# Patient Record
Sex: Female | Born: 1960
Health system: Southern US, Community
[De-identification: ages and names within clinical notes are randomized; demographics above are authoritative.]

## PROBLEM LIST (undated history)

## (undated) DIAGNOSIS — IMO0002 Reserved for concepts with insufficient information to code with codable children: Secondary | ICD-10-CM

## (undated) DIAGNOSIS — T7840XA Allergy, unspecified, initial encounter: Secondary | ICD-10-CM

## (undated) DIAGNOSIS — Z8632 Personal history of gestational diabetes: Secondary | ICD-10-CM

## (undated) DIAGNOSIS — B009 Herpesviral infection, unspecified: Secondary | ICD-10-CM

## (undated) HISTORY — PX: LASIK: SHX215

## (undated) HISTORY — DX: Herpesviral infection, unspecified: B00.9

## (undated) HISTORY — DX: Reserved for concepts with insufficient information to code with codable children: IMO0002

## (undated) HISTORY — DX: Allergy, unspecified, initial encounter: T78.40XA

## (undated) HISTORY — PX: OTHER SURGICAL HISTORY: SHX169

## (undated) HISTORY — DX: Personal history of gestational diabetes: Z86.32

---

## 2000-08-29 ENCOUNTER — Other Ambulatory Visit: Admission: RE | Admit: 2000-08-29 | Discharge: 2000-08-29 | Payer: Self-pay | Admitting: Gynecology

## 2000-10-17 ENCOUNTER — Encounter: Admission: RE | Admit: 2000-10-17 | Discharge: 2001-01-15 | Payer: Self-pay | Admitting: Gynecology

## 2002-06-02 ENCOUNTER — Other Ambulatory Visit: Admission: RE | Admit: 2002-06-02 | Discharge: 2002-06-02 | Payer: Self-pay | Admitting: Gynecology

## 2003-06-28 ENCOUNTER — Emergency Department (HOSPITAL_COMMUNITY): Admission: AD | Admit: 2003-06-28 | Discharge: 2003-06-28 | Payer: Self-pay | Admitting: Family Medicine

## 2004-01-28 ENCOUNTER — Other Ambulatory Visit: Admission: RE | Admit: 2004-01-28 | Discharge: 2004-01-28 | Payer: Self-pay | Admitting: Gynecology

## 2005-05-24 DIAGNOSIS — IMO0002 Reserved for concepts with insufficient information to code with codable children: Secondary | ICD-10-CM

## 2005-05-24 HISTORY — DX: Reserved for concepts with insufficient information to code with codable children: IMO0002

## 2005-05-25 ENCOUNTER — Other Ambulatory Visit: Admission: RE | Admit: 2005-05-25 | Discharge: 2005-05-25 | Payer: Self-pay | Admitting: Gynecology

## 2006-02-02 ENCOUNTER — Encounter: Admission: RE | Admit: 2006-02-02 | Discharge: 2006-02-02 | Payer: Self-pay | Admitting: Internal Medicine

## 2006-02-02 ENCOUNTER — Ambulatory Visit: Payer: Self-pay | Admitting: Internal Medicine

## 2006-02-28 ENCOUNTER — Other Ambulatory Visit: Admission: RE | Admit: 2006-02-28 | Discharge: 2006-02-28 | Payer: Self-pay | Admitting: Gynecology

## 2006-03-14 ENCOUNTER — Encounter: Admission: RE | Admit: 2006-03-14 | Discharge: 2006-03-14 | Payer: Self-pay | Admitting: Gynecology

## 2006-08-07 ENCOUNTER — Ambulatory Visit: Payer: Self-pay | Admitting: Internal Medicine

## 2006-09-03 ENCOUNTER — Encounter: Admission: RE | Admit: 2006-09-03 | Discharge: 2006-09-03 | Payer: Self-pay | Admitting: Gynecology

## 2007-03-22 ENCOUNTER — Encounter: Admission: RE | Admit: 2007-03-22 | Discharge: 2007-03-22 | Payer: Self-pay | Admitting: Gynecology

## 2007-09-11 ENCOUNTER — Other Ambulatory Visit: Admission: RE | Admit: 2007-09-11 | Discharge: 2007-09-11 | Payer: Self-pay | Admitting: Gynecology

## 2008-01-17 ENCOUNTER — Other Ambulatory Visit: Admission: RE | Admit: 2008-01-17 | Discharge: 2008-01-17 | Payer: Self-pay | Admitting: Gynecology

## 2008-04-01 ENCOUNTER — Ambulatory Visit: Payer: Self-pay | Admitting: Family Medicine

## 2008-06-04 ENCOUNTER — Ambulatory Visit: Payer: Self-pay | Admitting: Internal Medicine

## 2008-06-29 ENCOUNTER — Ambulatory Visit: Payer: Self-pay | Admitting: Internal Medicine

## 2008-08-10 ENCOUNTER — Ambulatory Visit: Payer: Self-pay | Admitting: Internal Medicine

## 2008-08-20 ENCOUNTER — Telehealth: Payer: Self-pay | Admitting: Internal Medicine

## 2008-09-16 ENCOUNTER — Telehealth: Payer: Self-pay | Admitting: Internal Medicine

## 2008-10-12 ENCOUNTER — Other Ambulatory Visit: Admission: RE | Admit: 2008-10-12 | Discharge: 2008-10-12 | Payer: Self-pay | Admitting: Gynecology

## 2008-10-12 ENCOUNTER — Encounter: Payer: Self-pay | Admitting: Gynecology

## 2008-10-12 ENCOUNTER — Ambulatory Visit: Payer: Self-pay | Admitting: Gynecology

## 2009-02-02 ENCOUNTER — Ambulatory Visit: Payer: Self-pay | Admitting: Internal Medicine

## 2009-02-02 LAB — CONVERTED CEMR LAB
BUN: 20 mg/dL (ref 6–23)
Bilirubin, Direct: 0.1 mg/dL (ref 0.0–0.3)
CO2: 29 meq/L (ref 19–32)
Calcium: 9.3 mg/dL (ref 8.4–10.5)
Chloride: 106 meq/L (ref 96–112)
Cholesterol: 251 mg/dL — ABNORMAL HIGH (ref 0–200)
Creatinine, Ser: 0.9 mg/dL (ref 0.4–1.2)
Direct LDL: 160.1 mg/dL
Eosinophils Absolute: 0.2 10*3/uL (ref 0.0–0.7)
Glucose, Urine, Semiquant: NEGATIVE
Ketones, urine, test strip: NEGATIVE
Lymphocytes Relative: 33.6 % (ref 12.0–46.0)
MCHC: 34 g/dL (ref 30.0–36.0)
MCV: 91.1 fL (ref 78.0–100.0)
Monocytes Absolute: 0.4 10*3/uL (ref 0.1–1.0)
Neutrophils Relative %: 49.8 % (ref 43.0–77.0)
Platelets: 349 10*3/uL (ref 150.0–400.0)
Total Bilirubin: 0.8 mg/dL (ref 0.3–1.2)
Triglycerides: 51 mg/dL (ref 0.0–149.0)
VLDL: 10.2 mg/dL (ref 0.0–40.0)
WBC: 4.4 10*3/uL — ABNORMAL LOW (ref 4.5–10.5)
pH: 5.5

## 2009-04-08 ENCOUNTER — Ambulatory Visit: Payer: Self-pay | Admitting: Internal Medicine

## 2009-05-14 ENCOUNTER — Encounter: Admission: RE | Admit: 2009-05-14 | Discharge: 2009-05-14 | Payer: Self-pay | Admitting: Gynecology

## 2009-10-27 ENCOUNTER — Ambulatory Visit: Payer: Self-pay | Admitting: Gynecology

## 2009-10-27 ENCOUNTER — Other Ambulatory Visit: Admission: RE | Admit: 2009-10-27 | Discharge: 2009-10-27 | Payer: Self-pay | Admitting: Gynecology

## 2009-11-03 ENCOUNTER — Ambulatory Visit: Payer: Self-pay | Admitting: Gynecology

## 2010-03-17 ENCOUNTER — Ambulatory Visit: Payer: Self-pay | Admitting: Gynecology

## 2010-03-21 ENCOUNTER — Ambulatory Visit: Payer: Self-pay | Admitting: Family Medicine

## 2010-03-21 LAB — CONVERTED CEMR LAB: Rapid Strep: POSITIVE

## 2010-04-05 ENCOUNTER — Telehealth: Payer: Self-pay | Admitting: Internal Medicine

## 2010-06-09 ENCOUNTER — Encounter: Admission: RE | Admit: 2010-06-09 | Discharge: 2010-06-09 | Payer: Self-pay | Admitting: Gynecology

## 2010-07-09 ENCOUNTER — Emergency Department (HOSPITAL_COMMUNITY)
Admission: EM | Admit: 2010-07-09 | Discharge: 2010-07-09 | Payer: Self-pay | Source: Home / Self Care | Admitting: Family Medicine

## 2010-08-21 LAB — CONVERTED CEMR LAB
Pap Smear: NORMAL
Vit D, 25-Hydroxy: 31 ng/mL (ref 30–89)

## 2010-08-23 ENCOUNTER — Ambulatory Visit
Admission: RE | Admit: 2010-08-23 | Discharge: 2010-08-23 | Payer: Self-pay | Source: Home / Self Care | Attending: Gynecology | Admitting: Gynecology

## 2010-08-25 NOTE — Assessment & Plan Note (Signed)
Summary: SORE THROAT // RS/PT RSC/CJR   Vital Signs:  Patient profile:   50 year old female Temp:     98.0 degrees F oral BP sitting:   90 / 68  (left arm) Cuff size:   regular  Vitals Entered By: Sid Falcon LPN (March 21, 2010 1:51 PM) CC: sore throat   History of Present Illness: Patient seen with onset of sore throat this past Friday. She's had some malaise and intermittent headache. Some chills but no definite fever. Minimal nasal congestion. No significant cough. No ill contacts.  Allergies (verified): No Known Drug Allergies  Past History:  Past Medical History: Last updated: 06/29/2008 Allergic rhinitis  Review of Systems      See HPI  Physical Exam  General:  Well-developed,well-nourished,in no acute distress; alert,appropriate and cooperative throughout examination Eyes:  No corneal or conjunctival inflammation noted. EOMI. Perrla. Funduscopic exam benign, without hemorrhages, exudates or papilledema. Vision grossly normal. Ears:  External ear exam shows no significant lesions or deformities.  Otoscopic examination reveals clear canals, tympanic membranes are intact bilaterally without bulging, retraction, inflammation or discharge. Hearing is grossly normal bilaterally. Mouth:  minimal erythema without exudate Neck:  minimal anterior cervical adenopathy Lungs:  Normal respiratory effort, chest expands symmetrically. Lungs are clear to auscultation, no crackles or wheezes. Heart:  Normal rate and regular rhythm. S1 and S2 normal without gallop, murmur, click, rub or other extra sounds. Skin:  no rashes.     Impression & Recommendations:  Problem # 1:  SORE THROAT (ICD-462)  positive strep. Amoxil 875 mg b.i.d. for 10 days Orders: Rapid Strep (37628)  Her updated medication list for this problem includes:    Amoxicillin 875 Mg Tabs (Amoxicillin) ..... One by mouth two times a day for 10 days  Complete Medication List: 1)  Astepro 0.15 % Soln  (Azelastine hcl) .... Two spray q nare daily 2)  Nasonex 50 Mcg/act Susp (Mometasone furoate) .... One spray each nostril two times a day 3)  Pataday 0.2 % Soln (Olopatadine hcl) .... One drop each eye daily as needed 4)  Lexapro 5 Mg Tabs (Escitalopram oxalate) .Marland Kitchen.. 1 once daily 5)  Amoxicillin 875 Mg Tabs (Amoxicillin) .... One by mouth two times a day for 10 days  Patient Instructions: 1)  Continue Motrin, Alleve, or Tylenol for symptom relief. Prescriptions: AMOXICILLIN 875 MG TABS (AMOXICILLIN) one by mouth two times a day for 10 days  #20 x 0   Entered and Authorized by:   Evelena Peat MD   Signed by:   Evelena Peat MD on 03/21/2010   Method used:   Electronically to        [facility] [address] [id number]* (retail)       [address]       [address]       [place], [place]  [address]       Ph: [phone]       Fax: [phone]   RxID:   [id number]   Laboratory Results    Other Tests  Rapid Strep: positive Comments: Rita Ohara  March 21, 2010 2:15 PM   Kit Test Internal QC: Positive   (Normal Range: Negative)

## 2010-08-25 NOTE — Progress Notes (Signed)
Summary: sinus infection  Phone Note Call from Patient   Caller: Patient Call For: Stacie Glaze MD Summary of Call: Pt. is in [facility], and has sore throat and sinus congestion...Marland KitchenMarland KitchenMarland Kitchenwants antibiotic called to [facility]....[phone]  Ill x 4 days.  No fever.  Leaving for [place] at 11 am. Initial call taken by: Lynann Beaver CMA,  April 05, 2010 9:27 AM  Follow-up for Phone Call        per dr Lovell Sheehan may have z pack Follow-up by: Willy Eddy, LPN,  April 05, 2010 11:01 AM  Additional Follow-up for Phone Call Additional follow up Details #1::        Called to pharmacy. Additional Follow-up by: Lynann Beaver CMA,  April 05, 2010 11:06 AM

## 2010-09-12 ENCOUNTER — Other Ambulatory Visit (INDEPENDENT_AMBULATORY_CARE_PROVIDER_SITE_OTHER): Payer: BC Managed Care – PPO | Admitting: Internal Medicine

## 2010-09-12 DIAGNOSIS — Z0000 Encounter for general adult medical examination without abnormal findings: Secondary | ICD-10-CM

## 2010-09-12 DIAGNOSIS — E785 Hyperlipidemia, unspecified: Secondary | ICD-10-CM

## 2010-09-12 LAB — CBC WITH DIFFERENTIAL/PLATELET
Basophils Absolute: 0 10*3/uL (ref 0.0–0.1)
Hemoglobin: 13 g/dL (ref 12.0–15.0)
Lymphocytes Relative: 26.2 % (ref 12.0–46.0)
Monocytes Relative: 11.1 % (ref 3.0–12.0)
Neutro Abs: 2.8 10*3/uL (ref 1.4–7.7)
RBC: 4.16 Mil/uL (ref 3.87–5.11)
RDW: 13.9 % (ref 11.5–14.6)
WBC: 4.8 10*3/uL (ref 4.5–10.5)

## 2010-09-12 LAB — POCT URINALYSIS DIPSTICK
Ketones, UA: NEGATIVE
Leukocytes, UA: NEGATIVE
Nitrite, UA: NEGATIVE
Protein, UA: NEGATIVE
Urobilinogen, UA: 0.2

## 2010-09-12 LAB — HEPATIC FUNCTION PANEL
AST: 32 U/L (ref 0–37)
Albumin: 3.9 g/dL (ref 3.5–5.2)
Alkaline Phosphatase: 54 U/L (ref 39–117)

## 2010-09-12 LAB — BASIC METABOLIC PANEL
Calcium: 8.7 mg/dL (ref 8.4–10.5)
GFR: 82.26 mL/min (ref >60.00)
Glucose, Bld: 73 mg/dL (ref 70–99)
Sodium: 137 mEq/L (ref 135–145)

## 2010-09-12 LAB — LDL CHOLESTEROL, DIRECT: Direct LDL: 144.1 mg/dL

## 2010-09-12 LAB — LIPID PANEL
Total CHOL/HDL Ratio: 3
VLDL: 8.4 mg/dL (ref 0.0–40.0)

## 2010-09-13 ENCOUNTER — Telehealth: Payer: Self-pay | Admitting: *Deleted

## 2010-09-13 NOTE — Telephone Encounter (Signed)
Pt is complaining of sinus infection, with cough and headache with drainage.  Needs to feel better by Saturday.  Husband was given a steroid injection, antibioitics and cough syrup, and felt better quickly.

## 2010-09-13 NOTE — Telephone Encounter (Signed)
Please call patient and tell her to come in in the morning around 9:30 AM  Saundrea to sechedule Open the schedule so that she can be put in the schedule

## 2010-09-13 NOTE — Telephone Encounter (Signed)
appointment made and pt informed

## 2010-09-14 ENCOUNTER — Ambulatory Visit: Payer: BC Managed Care – PPO | Admitting: Internal Medicine

## 2010-09-14 ENCOUNTER — Encounter: Payer: Self-pay | Admitting: Internal Medicine

## 2010-09-14 VITALS — BP 100/60 | HR 68 | Temp 99.2°F | Resp 14 | Ht 65.0 in | Wt 125.0 lb

## 2010-09-14 DIAGNOSIS — R059 Cough, unspecified: Secondary | ICD-10-CM

## 2010-09-14 DIAGNOSIS — R05 Cough: Secondary | ICD-10-CM

## 2010-09-14 DIAGNOSIS — J0100 Acute maxillary sinusitis, unspecified: Secondary | ICD-10-CM

## 2010-09-14 DIAGNOSIS — J029 Acute pharyngitis, unspecified: Secondary | ICD-10-CM

## 2010-09-14 DIAGNOSIS — N951 Menopausal and female climacteric states: Secondary | ICD-10-CM

## 2010-09-14 MED ORDER — LEVOFLOXACIN 500 MG PO TABS: 500.0000 mg | ORAL_TABLET | Freq: Every day | ORAL | Status: AC

## 2010-09-14 MED ORDER — METHYLPREDNISOLONE ACETATE 80 MG/ML IJ SUSP: 80.0000 mg | Freq: Once | INTRAMUSCULAR | Status: DC

## 2010-09-14 MED ORDER — METHYLPREDNISOLONE ACETATE 80 MG/ML IJ SUSP
80.0000 mg | Freq: Once | INTRAMUSCULAR | Status: AC
  Administered 2010-09-14: 80 mg via INTRAMUSCULAR

## 2010-09-14 MED ORDER — PHENYLEPHRINE-APAP-GUAIFENESIN 10-650-400 MG/20ML PO LIQD: 5.0000 mL | Freq: Four times a day (QID) | ORAL | Status: DC | PRN

## 2010-09-14 NOTE — Assessment & Plan Note (Signed)
Hot flashes have resumed and now stopped with the OTC supplement

## 2010-09-14 NOTE — Assessment & Plan Note (Signed)
Hot flashes have significantly been reduced by this soy supplement she is not on hormone therapy.

## 2010-09-14 NOTE — Assessment & Plan Note (Signed)
Sore throat with congestion and cough for about 3 days prior to presentation no fever chills cough is productive an associated symptoms include headache myalgias her physical examination is most consistent with sinusitis it appears to be more prominent on the right in the maxillary sinus area

## 2010-09-14 NOTE — Progress Notes (Signed)
  Subjective:    Patient ID: Virginia Hanna, female    DOB: [birth year], 50 y.o.   MRN: [id number]  HPI    Review of Systems     Objective:   Physical Exam        Assessment & Plan:

## 2010-09-19 ENCOUNTER — Ambulatory Visit: Payer: BC Managed Care – PPO | Admitting: Internal Medicine

## 2010-09-19 ENCOUNTER — Telehealth: Payer: Self-pay | Admitting: *Deleted

## 2010-09-19 VITALS — BP 106/70 | HR 72 | Temp 98.5°F | Resp 14 | Ht 65.0 in | Wt 125.0 lb

## 2010-09-19 DIAGNOSIS — J189 Pneumonia, unspecified organism: Secondary | ICD-10-CM

## 2010-09-19 DIAGNOSIS — J157 Pneumonia due to Mycoplasma pneumoniae: Secondary | ICD-10-CM

## 2010-09-19 MED ORDER — AZITHROMYCIN 250 MG PO TABS: ORAL_TABLET | ORAL | Status: AC

## 2010-09-19 MED ORDER — ESTROVEN PM PO TABS: 1.0000 | ORAL_TABLET | ORAL | Status: DC

## 2010-09-19 MED ORDER — AZITHROMYCIN 250 MG PO TABS: ORAL_TABLET | ORAL | Status: DC

## 2010-09-19 MED ORDER — PSEUDOEPH-CHLORPHEN-HYDROCOD 60-4-5 MG/5ML PO SOLN: 5.0000 mL | Freq: Three times a day (TID) | ORAL | Status: DC | PRN

## 2010-09-19 MED ORDER — METHYLPREDNISOLONE (PAK) 4 MG PO TABS: 4.0000 mg | ORAL_TABLET | Freq: Every day | ORAL | Status: DC

## 2010-09-19 NOTE — Telephone Encounter (Signed)
Call pt for OV

## 2010-09-19 NOTE — Telephone Encounter (Signed)
[facility] Triage Call Report Triage Record Num: [id number] Operator: Jeraldine Loots Patient Name: Tilia Faso Call Date & Time: 09/18/2010 1:47:18PM Patient Phone: [phone] PCP: Darryll Capers Patient Gender: Female PCP Fax : [phone] Patient DOB: 12-09-1960 [facility] Name: [facility] Reason for Call: Pt calling with cough, congestion and a h/a. Was seen in the office on 2/22 for same and placed on Levoflaxin and musinex . Has a temp 100. Nasal secretions are yellow. Needs to f/u in 24 hours. Encouraged to continue with the medication she is currently on. She will call the office in am. Protocol(s) Used: Cough - Adult Recommended Outcome per Protocol: See Provider within 24 hours Reason for Outcome: Facial pain (fullness, pressure, worsens with bending over), frontal headache, yellow-green nasal discharge AND any temperature elevation Care Advice: ~ Use a cool mist humidifier to moisten air. Be sure to clean according to manufacturer's instructions. ~ See provider in 8 hours if redness, swelling and tenderness develops above or below eyes/near ears/over sinuses ~ SYMPTOM / CONDITION MANAGEMENT 09/18/2010 1:53:06PM Page 1 of 1 [email or web address]

## 2010-09-19 NOTE — Telephone Encounter (Signed)
Appointment given for 3pm

## 2010-10-04 ENCOUNTER — Ambulatory Visit: Payer: BC Managed Care – PPO | Admitting: Internal Medicine

## 2010-10-04 VITALS — BP 104/70 | HR 72 | Temp 98.2°F | Resp 14 | Ht 65.0 in | Wt 120.0 lb

## 2010-10-04 DIAGNOSIS — R011 Cardiac murmur, unspecified: Secondary | ICD-10-CM | POA: Insufficient documentation

## 2010-10-04 MED ORDER — OLOPATADINE HCL 0.1 % OP SOLN: 1.0000 [drp] | Freq: Two times a day (BID) | OPHTHALMIC | Status: AC

## 2010-10-04 MED ORDER — OLOPATADINE HCL 0.1 % OP SOLN: 1.0000 [drp] | Freq: Two times a day (BID) | OPHTHALMIC | Status: DC

## 2010-10-04 NOTE — Progress Notes (Signed)
  Subjective:    Patient ID: Virginia Hanna, female    DOB: 10/14/1960, 49 y.o.   MRN: [id number]  HPI    Review of Systems     Objective:   Physical Exam        Assessment & Plan:   

## 2010-10-05 ENCOUNTER — Telehealth: Payer: Self-pay | Admitting: Internal Medicine

## 2010-10-05 NOTE — Telephone Encounter (Signed)
Pharmacist will give name of otc med to take

## 2010-10-05 NOTE — Telephone Encounter (Signed)
Pharmacist called and said rcvd escribe for Patanol .1%. Pt says this is too expensive. Pt req cheaper alternative.

## 2010-12-01 ENCOUNTER — Encounter: Payer: BC Managed Care – PPO | Admitting: Gynecology

## 2010-12-15 ENCOUNTER — Encounter: Payer: BC Managed Care – PPO | Admitting: Gynecology

## 2010-12-21 ENCOUNTER — Other Ambulatory Visit (HOSPITAL_COMMUNITY)
Admission: RE | Admit: 2010-12-21 | Discharge: 2010-12-21 | Disposition: A | Discharge status: Home / Self Care | Payer: BC Managed Care – PPO | Source: Ambulatory Visit | Attending: Gynecology | Admitting: Gynecology

## 2010-12-21 ENCOUNTER — Encounter (INDEPENDENT_AMBULATORY_CARE_PROVIDER_SITE_OTHER): Payer: BC Managed Care – PPO | Admitting: Gynecology

## 2010-12-21 ENCOUNTER — Other Ambulatory Visit: Payer: Self-pay | Admitting: Gynecology

## 2010-12-21 DIAGNOSIS — Z13220 Encounter for screening for lipoid disorders: Secondary | ICD-10-CM

## 2010-12-21 DIAGNOSIS — Z833 Family history of diabetes mellitus: Secondary | ICD-10-CM

## 2010-12-21 DIAGNOSIS — Z01419 Encounter for gynecological examination (general) (routine) without abnormal findings: Secondary | ICD-10-CM

## 2010-12-21 DIAGNOSIS — R82998 Other abnormal findings in urine: Secondary | ICD-10-CM

## 2010-12-21 DIAGNOSIS — N390 Urinary tract infection, site not specified: Secondary | ICD-10-CM

## 2010-12-21 DIAGNOSIS — Z124 Encounter for screening for malignant neoplasm of cervix: Secondary | ICD-10-CM | POA: Insufficient documentation

## 2011-05-04 ENCOUNTER — Other Ambulatory Visit: Payer: Self-pay | Admitting: Gynecology

## 2011-05-04 DIAGNOSIS — Z1231 Encounter for screening mammogram for malignant neoplasm of breast: Secondary | ICD-10-CM

## 2011-06-12 ENCOUNTER — Ambulatory Visit
Admission: RE | Admit: 2011-06-12 | Discharge: 2011-06-12 | Disposition: A | Discharge status: Home / Self Care | Payer: BC Managed Care – PPO | Source: Ambulatory Visit | Attending: Gynecology | Admitting: Gynecology

## 2011-06-12 DIAGNOSIS — Z1231 Encounter for screening mammogram for malignant neoplasm of breast: Secondary | ICD-10-CM

## 2011-08-29 ENCOUNTER — Encounter: Payer: Self-pay | Admitting: Family

## 2011-08-29 ENCOUNTER — Ambulatory Visit (INDEPENDENT_AMBULATORY_CARE_PROVIDER_SITE_OTHER): Payer: BC Managed Care – PPO | Admitting: Family

## 2011-08-29 VITALS — BP 104/70 | Temp 99.9°F | Wt 124.0 lb

## 2011-08-29 DIAGNOSIS — J019 Acute sinusitis, unspecified: Secondary | ICD-10-CM

## 2011-08-29 DIAGNOSIS — J309 Allergic rhinitis, unspecified: Secondary | ICD-10-CM

## 2011-08-29 MED ORDER — HYDROCODONE-HOMATROPINE 5-1.5 MG/5ML PO SYRP: 5.0000 mL | ORAL_SOLUTION | Freq: Three times a day (TID) | ORAL | Status: AC | PRN

## 2011-08-29 MED ORDER — LEVOFLOXACIN 500 MG PO TABS: 500.0000 mg | ORAL_TABLET | Freq: Every day | ORAL | Status: AC

## 2011-08-29 MED ORDER — FEXOFENADINE-PSEUDOEPHED ER 180-240 MG PO TB24: 1.0000 | ORAL_TABLET | Freq: Every day | ORAL | Status: DC

## 2011-08-29 NOTE — Patient Instructions (Signed)

## 2011-08-29 NOTE — Progress Notes (Signed)
Subjective:    Patient ID: Virginia Hanna, female    DOB: Jun 22, 1961, 51 y.o.   MRN: [id number]  HPI Comments: C/o sinus drainage, thick yellow nasal mucus, nonproductive cough, low grade fever getting progressively worse since Friday. Had same s/s two months ago tx in urgent care with amoxicillin.       Review of Systems  Constitutional: Negative.   HENT: Positive for congestion, sore throat, rhinorrhea, postnasal drip and sinus pressure. Negative for hearing loss, ear pain, sneezing, mouth sores, trouble swallowing, voice change and ear discharge.   Eyes: Negative.   Respiratory: Negative.   Cardiovascular: Negative.    Past Medical History  Diagnosis Date  . Allergy   . Hx of vasectomy     PTS PARTNER WITH VASECTOMY  . LGSIL (low grade squamous intraepithelial dysplasia) 05/2005  . Diabetes mellitus     GESTATIONAL    History   Social History  . Marital Status: Married    Spouse Name: N/A    Number of Children: N/A  . Years of Education: N/A   Occupational History  . Not on file.   Social History Main Topics  . Smoking status: Never Smoker   . Smokeless tobacco: Not on file  . Alcohol Use: Yes  . Drug Use: No  . Sexually Active: Not on file   Other Topics Concern  . Not on file   Social History Narrative  . No narrative on file    Past Surgical History  Procedure Date  . Lasik BILATERAL    No family history on file.  No Known Allergies  Current Outpatient Prescriptions on File Prior to Visit  Medication Sig Dispense Refill  . escitalopram (LEXAPRO) 10 MG tablet Take 10 mg by mouth daily.        . fexofenadine (ALLEGRA) 180 MG tablet Take 180 mg by mouth daily.        Marland Kitchen olopatadine (PATANOL) 0.1 % ophthalmic solution Place 1 drop into both eyes 2 (two) times daily.  5 mL  12   Current Facility-Administered Medications on File Prior to Visit  Medication Dose Route Frequency Provider Last Rate Last Dose  . methylPREDNISolone acetate (DEPO-MEDROL)  injection 80 mg  80 mg Intramuscular Once Carrie Mew, MD        BP 104/70  Temp(Src) 99.9 F (37.7 C) (Oral)  Wt 124 lb (56.246 kg)chart     Objective:   Physical Exam  Constitutional: She is oriented to person, place, and time. She appears well-developed and well-nourished.  HENT:  Head: Normocephalic and atraumatic.  Right Ear: External ear normal.  Left Ear: External ear normal.  Mouth/Throat: No oropharyngeal exudate.  Eyes: Conjunctivae and EOM are normal. Pupils are equal, round, and reactive to light. Right eye exhibits no discharge. Left eye exhibits no discharge. No scleral icterus.  Cardiovascular: Normal rate, regular rhythm and normal heart sounds.  Exam reveals no gallop and no friction rub.   No murmur heard. Pulmonary/Chest: Effort normal and breath sounds normal. No respiratory distress. She has no wheezes. She has no rales. She exhibits no tenderness.  Abdominal: Soft. Bowel sounds are normal.  Neurological: She is alert and oriented to person, place, and time.  Skin: Skin is warm and dry. She is not diaphoretic.          Assessment & Plan:  Assessment Bacterial sinusitis and allergic rhinitis Plan: Levaquin, hycodan syrup, increase po fluids, continue otc vitamin c Teaching instructions provided with information on diagnosis and  treatment

## 2011-09-14 ENCOUNTER — Other Ambulatory Visit: Payer: Self-pay | Admitting: Family

## 2011-09-18 ENCOUNTER — Other Ambulatory Visit: Payer: Self-pay | Admitting: *Deleted

## 2011-09-18 MED ORDER — ESCITALOPRAM OXALATE 10 MG PO TABS: 10.0000 mg | ORAL_TABLET | Freq: Every day | ORAL | Status: DC

## 2011-09-18 NOTE — Telephone Encounter (Signed)
rx called in

## 2011-10-16 ENCOUNTER — Other Ambulatory Visit: Payer: Self-pay | Admitting: *Deleted

## 2011-10-16 MED ORDER — ESCITALOPRAM OXALATE 10 MG PO TABS: 10.0000 mg | ORAL_TABLET | Freq: Every day | ORAL | Status: DC

## 2011-10-16 NOTE — Telephone Encounter (Signed)
rx called in

## 2011-12-04 ENCOUNTER — Other Ambulatory Visit: Payer: Self-pay | Admitting: *Deleted

## 2011-12-04 MED ORDER — ESCITALOPRAM OXALATE 10 MG PO TABS: 10.0000 mg | ORAL_TABLET | Freq: Every day | ORAL | Status: DC

## 2011-12-27 ENCOUNTER — Ambulatory Visit (INDEPENDENT_AMBULATORY_CARE_PROVIDER_SITE_OTHER): Payer: BC Managed Care – PPO | Admitting: Gynecology

## 2011-12-27 ENCOUNTER — Encounter: Payer: Self-pay | Admitting: Gynecology

## 2011-12-27 ENCOUNTER — Encounter: Payer: BC Managed Care – PPO | Admitting: Gynecology

## 2011-12-27 VITALS — BP 100/62 | Ht 65.0 in | Wt 118.0 lb

## 2011-12-27 DIAGNOSIS — N390 Urinary tract infection, site not specified: Secondary | ICD-10-CM

## 2011-12-27 DIAGNOSIS — F419 Anxiety disorder, unspecified: Secondary | ICD-10-CM

## 2011-12-27 DIAGNOSIS — F411 Generalized anxiety disorder: Secondary | ICD-10-CM

## 2011-12-27 DIAGNOSIS — Z01419 Encounter for gynecological examination (general) (routine) without abnormal findings: Secondary | ICD-10-CM

## 2011-12-27 MED ORDER — ESCITALOPRAM OXALATE 10 MG PO TABS: 10.0000 mg | ORAL_TABLET | Freq: Every day | ORAL | Status: DC

## 2011-12-27 NOTE — Patient Instructions (Signed)
Follow up in one year for annual gynecologic exam. 

## 2011-12-27 NOTE — Progress Notes (Signed)
Virginia Hanna 09/26/1960 [id number]        51 y.o.  for annual exam.  Doing well without issues.  Past medical history,surgical history, medications, allergies, family history and social history were all reviewed and documented in the EPIC chart. ROS:  Was performed and pertinent positives and negatives are included in the history.  Exam: Sherrilyn Rist chaperone present Filed Vitals:   12/27/11 1219  BP: 100/62   General appearance  Normal Skin grossly normal Head/Neck normal with no cervical or supraclavicular adenopathy thyroid normal Lungs  clear Cardiac RR, without RMG Abdominal  soft, nontender, without masses, organomegaly or hernia Breasts  examined lying and sitting without masses, retractions, discharge or axillary adenopathy. Pelvic  Ext/BUS/vagina  normal   Cervix  normal   Uterus  anteverted, normal size, shape and contour, midline and mobile nontender   Adnexa  Without masses or tenderness    Anus and perineum  normal   Rectovaginal  normal sphincter tone without palpated masses or tenderness.    Assessment/Plan:  51 y.o. female for annual exam with regular menses, vasectomy birth control.    1. Pap smear. No Pap smear was done today. She does have a history of low-grade SIL negative high-risk HPV in 2006 and ascus Pap smear with negative HPV in 2009. She has 4 normal subsequent Pap smears with her last normal 2012. Her review current screening guidelines and will plan on less frequent screening every 3-5 years. 2. Mammography. Patient had mammography 05/2011. Continue with annual mammograms. SBE monthly reviewed. 3. Colonoscopy. Patient is due for colonoscopy now encouraged her to schedule this and she agrees to do so. 4. Anxiety. Patient is on Lexapro 10 mg. She has tried stopping it but does have exacerbation of her anxiety and she prefers to continue this and I refilled her times a year. 5. Health maintenance. No blood work was done today as it is all done through Dr. Lovell Sheehan  office. She is being followed for elevated cholesterol and knows the importance of follow up with him for this.    Dara Lords MD, 12:56 PM 12/27/2011

## 2011-12-28 LAB — URINALYSIS W MICROSCOPIC + REFLEX CULTURE
Bacteria, UA: NONE SEEN
Crystals: NONE SEEN
Ketones, ur: NEGATIVE mg/dL
Nitrite: NEGATIVE
Specific Gravity, Urine: 1.01 (ref 1.005–1.030)
Urobilinogen, UA: 0.2 mg/dL (ref 0.0–1.0)

## 2011-12-29 MED ORDER — SULFAMETHOXAZOLE-TMP DS 800-160 MG PO TABS: 1.0000 | ORAL_TABLET | Freq: Two times a day (BID) | ORAL | Status: AC

## 2011-12-29 NOTE — Progress Notes (Signed)
Addended by: Dara Lords on: 12/29/2011 12:38 PM   Modules accepted: Orders

## 2011-12-30 LAB — URINE CULTURE: Colony Count: 55000

## 2012-05-08 ENCOUNTER — Other Ambulatory Visit: Payer: Self-pay | Admitting: Gynecology

## 2012-05-08 DIAGNOSIS — Z1231 Encounter for screening mammogram for malignant neoplasm of breast: Secondary | ICD-10-CM

## 2012-06-12 ENCOUNTER — Ambulatory Visit
Admission: RE | Admit: 2012-06-12 | Discharge: 2012-06-12 | Disposition: A | Discharge status: Home / Self Care | Payer: BC Managed Care – PPO | Source: Ambulatory Visit | Attending: Gynecology | Admitting: Gynecology

## 2012-06-12 DIAGNOSIS — Z1231 Encounter for screening mammogram for malignant neoplasm of breast: Secondary | ICD-10-CM

## 2012-10-29 DIAGNOSIS — Z0289 Encounter for other administrative examinations: Secondary | ICD-10-CM

## 2012-11-20 ENCOUNTER — Encounter: Payer: Self-pay | Admitting: Gynecology

## 2012-11-20 ENCOUNTER — Ambulatory Visit (INDEPENDENT_AMBULATORY_CARE_PROVIDER_SITE_OTHER): Payer: BC Managed Care – PPO | Admitting: Gynecology

## 2012-11-20 DIAGNOSIS — N926 Irregular menstruation, unspecified: Secondary | ICD-10-CM

## 2012-11-20 DIAGNOSIS — N951 Menopausal and female climacteric states: Secondary | ICD-10-CM

## 2012-11-20 MED ORDER — ESTRADIOL 0.05 MG/24HR TD PTTW: 1.0000 | MEDICATED_PATCH | TRANSDERMAL | Status: DC

## 2012-11-20 MED ORDER — PROGESTERONE MICRONIZED 100 MG PO CAPS: 100.0000 mg | ORAL_CAPSULE | Freq: Every day | ORAL | Status: DC

## 2012-11-20 NOTE — Progress Notes (Signed)
Patient presents to discuss menopausal symptoms. Over the past year her menses have become more sporadic we she will skip up to several months. No persistent atypical bleeding. Regular menses when they come. More bothersome is increasing hot flushes and night sweats. She tried OTC maximum strength soy products without relief. She currently is on Lexapro. I reviewed the perimenopause with her and the options for management include observation, hormonal manipulation, psychoactive medication all discussed. She is on Lexapro I don't think adding a different medication such as Effexor-wise at this point. I discussed options for HRT with continuous and intermittent, low dose oral contraceptives, transdermal versus oral. I reviewed the WHI study and risks to include stroke heart attack DVT and breast cancer. I discussed the ACOG and NAMS statements for lowest dose the shortest period of time. After a lengthy discussion we both agree on trial of low-dose HRT. Will start with minivelle 0.05 and Prometrium 100 mg nightly.  If irregular bleeding is an issue we may switch to a intermittent progesterone withdrawal up to and including a low-dose oral contraceptive. Patient actually has her annual exam scheduled in another month or so and she'll represent that point and we'll see how she's doing.

## 2012-11-20 NOTE — Patient Instructions (Signed)
Start on hormone replacement as we discussed. Call me if you have any issues. Otherwise followup for your scheduled annual exam.

## 2013-01-07 ENCOUNTER — Encounter: Payer: Self-pay | Admitting: Gynecology

## 2013-01-19 ENCOUNTER — Other Ambulatory Visit: Payer: Self-pay | Admitting: Gynecology

## 2013-01-27 ENCOUNTER — Encounter: Payer: Self-pay | Admitting: Gynecology

## 2013-01-27 ENCOUNTER — Ambulatory Visit (INDEPENDENT_AMBULATORY_CARE_PROVIDER_SITE_OTHER): Payer: BC Managed Care – PPO | Admitting: Gynecology

## 2013-01-27 VITALS — BP 104/66 | Ht 64.0 in | Wt 120.0 lb

## 2013-01-27 DIAGNOSIS — N926 Irregular menstruation, unspecified: Secondary | ICD-10-CM

## 2013-01-27 DIAGNOSIS — N951 Menopausal and female climacteric states: Secondary | ICD-10-CM

## 2013-01-27 DIAGNOSIS — Z01419 Encounter for gynecological examination (general) (routine) without abnormal findings: Secondary | ICD-10-CM

## 2013-01-27 DIAGNOSIS — Z13220 Encounter for screening for lipoid disorders: Secondary | ICD-10-CM

## 2013-01-27 LAB — CBC WITH DIFFERENTIAL/PLATELET
Eosinophils Absolute: 0.3 10*3/uL (ref 0.0–0.7)
HCT: 37.1 % (ref 36.0–46.0)
Hemoglobin: 12.4 g/dL (ref 12.0–15.0)
Lymphs Abs: 1.6 10*3/uL (ref 0.7–4.0)
MCH: 29.7 pg (ref 26.0–34.0)
MCHC: 33.4 g/dL (ref 30.0–36.0)
Monocytes Absolute: 0.6 10*3/uL (ref 0.1–1.0)
Monocytes Relative: 10 % (ref 3–12)
Neutro Abs: 3.3 10*3/uL (ref 1.7–7.7)
Neutrophils Relative %: 56 % (ref 43–77)
RBC: 4.17 MIL/uL (ref 3.87–5.11)

## 2013-01-27 MED ORDER — NORETHINDRONE ACET-ETHINYL EST 1-20 MG-MCG PO TABS: 1.0000 | ORAL_TABLET | Freq: Every day | ORAL | Status: DC

## 2013-01-27 NOTE — Patient Instructions (Signed)
Start on birth control pills as we discussed. Every other month withdrawal to start. Extend out every third month if you do well with this. Call me if you have any questions. Schedule your colonoscopy this coming year. Followup in one year for annual exam.

## 2013-01-27 NOTE — Progress Notes (Signed)
Virginia Hanna 29-Nov-1960 [id number]        52 y.o.  G2P2002 for annual exam.  Several issues noted below.  Past medical history,surgical history, medications, allergies, family history and social history were all reviewed and documented in the EPIC chart.  ROS:  Performed and pertinent positives and negatives are included in the history, assessment and plan .  Exam: Biomedical scientist Filed Vitals:   01/27/13 1559  BP: 104/66  Height: 5\' 4"  (1.626 m)  Weight: 120 lb (54.432 kg)   General appearance  Normal Skin grossly normal Head/Neck normal with no cervical or supraclavicular adenopathy thyroid normal Lungs  clear Cardiac RR, without RMG Abdominal  soft, nontender, without masses, organomegaly or hernia Breasts  examined lying and sitting without masses, retractions, discharge or axillary adenopathy. Pelvic  Ext/BUS/vagina  normal with menses type flow.  Cervix  normal   Uterus  anteverted, normal size, shape and contour, midline and mobile nontender   Adnexa  Without masses or tenderness    Anus and perineum  normal   Rectovaginal  normal sphincter tone without palpated masses or tenderness.    Assessment/Plan:  52 y.o. G36P2002 female for annual exam.   1. Perimenopausal. Patient continues with irregular menses which she will skip up to 3 months. No prolonged intermenstrual or atypical bleeding. Currently on menses now.  Also having hot flashes and sweats. Had talked about HRT per my 11/20/2012 note. She was to start on Vivelle and Prometrium. She took the samples of Vivelle never started the Prometrium. Said she felt better but now is off of the Vivelle she ran out of samples. I again reviewed options for management of her symptoms to include observation, HRT, Duavee and low-dose BCPs. Advantages/disadvantages of each choice reviewed. Advantages of low dose BCPs from a symptom relief as well as menstrual regulation standpoint discussed. Risks of estrogen to include stroke heart  attack DVT reviewed as well as breast cancer issues with HRT. After lengthy discussion she wants to try low-dose oral contraceptives. She's not been followed for a medical issues, does not smoke, does not have migraine headaches. Recommended every other month withdrawal to start and then extend this out to every third month if she tolerates. He'll stay on this for the next year and then readdress at her next annual exam. 2. Pap smear 2012. No Pap smear done today. History of low-grade SIL in the past but normal Pap smears over the last several years x4. Plan repeat Pap smear next year a 3 year interval. 3. Mammography November 2013. Continued annual mammography. SBE monthly reviewed. 4. Colonoscopy. Patient plans to schedule this year. 5. Health maintenance. Baseline CBC lipid profile comprehensive metabolic panel TSH vitamin D urinalysis ordered. Followup in one year, sooner if any issues.  Note: This document was prepared with digital dictation and possible smart phrase technology. Any transcriptional errors that result from this process are unintentional.   Dara Lords MD, 4:42 PM 01/27/2013

## 2013-01-28 LAB — LIPID PANEL
Cholesterol: 267 mg/dL — ABNORMAL HIGH (ref 0–200)
HDL: 92 mg/dL (ref >39)
Triglycerides: 51 mg/dL (ref <150)

## 2013-01-28 LAB — URINALYSIS W MICROSCOPIC + REFLEX CULTURE
Bacteria, UA: NONE SEEN
Bilirubin Urine: NEGATIVE
Casts: NONE SEEN
Crystals: NONE SEEN
Glucose, UA: NEGATIVE mg/dL
Ketones, ur: NEGATIVE mg/dL
Specific Gravity, Urine: >1.03 — ABNORMAL HIGH (ref 1.005–1.030)
pH: 5.5 (ref 5.0–8.0)

## 2013-01-28 LAB — COMPREHENSIVE METABOLIC PANEL
Albumin: 4.4 g/dL (ref 3.5–5.2)
BUN: 25 mg/dL — ABNORMAL HIGH (ref 6–23)
CO2: 24 mEq/L (ref 19–32)
Glucose, Bld: 88 mg/dL (ref 70–99)
Potassium: 3.7 mEq/L (ref 3.5–5.3)
Sodium: 140 mEq/L (ref 135–145)
Total Protein: 6.5 g/dL (ref 6.0–8.3)

## 2013-01-29 LAB — URINE CULTURE: Colony Count: 2000

## 2013-02-18 ENCOUNTER — Other Ambulatory Visit: Payer: Self-pay | Admitting: Gynecology

## 2013-02-18 NOTE — Telephone Encounter (Signed)
I see where you prescribed this at 01/11/12 [facility] but I did not see mention of it in her recent [facility] note from 01/27/2013.

## 2013-06-06 ENCOUNTER — Encounter: Payer: Self-pay | Admitting: Gynecology

## 2013-06-17 ENCOUNTER — Encounter: Payer: Self-pay | Admitting: Internal Medicine

## 2013-07-30 ENCOUNTER — Other Ambulatory Visit: Payer: Self-pay

## 2013-07-30 DIAGNOSIS — Z1231 Encounter for screening mammogram for malignant neoplasm of breast: Secondary | ICD-10-CM

## 2013-08-14 ENCOUNTER — Ambulatory Visit
Admission: RE | Admit: 2013-08-14 | Discharge: 2013-08-14 | Disposition: A | Discharge status: Home / Self Care | Payer: BC Managed Care – PPO | Source: Ambulatory Visit

## 2013-08-14 DIAGNOSIS — Z1231 Encounter for screening mammogram for malignant neoplasm of breast: Secondary | ICD-10-CM

## 2014-01-28 ENCOUNTER — Other Ambulatory Visit (HOSPITAL_COMMUNITY)
Admission: RE | Admit: 2014-01-28 | Discharge: 2014-01-28 | Disposition: A | Discharge status: Home / Self Care | Payer: BC Managed Care – PPO | Source: Ambulatory Visit | Attending: Gynecology | Admitting: Gynecology

## 2014-01-28 ENCOUNTER — Ambulatory Visit (INDEPENDENT_AMBULATORY_CARE_PROVIDER_SITE_OTHER): Payer: BC Managed Care – PPO | Admitting: Gynecology

## 2014-01-28 ENCOUNTER — Encounter: Payer: Self-pay | Admitting: Gynecology

## 2014-01-28 VITALS — BP 116/74 | Ht 65.0 in | Wt 122.0 lb

## 2014-01-28 DIAGNOSIS — Z1151 Encounter for screening for human papillomavirus (HPV): Secondary | ICD-10-CM | POA: Insufficient documentation

## 2014-01-28 DIAGNOSIS — Z01419 Encounter for gynecological examination (general) (routine) without abnormal findings: Secondary | ICD-10-CM

## 2014-01-28 DIAGNOSIS — N926 Irregular menstruation, unspecified: Secondary | ICD-10-CM

## 2014-01-28 DIAGNOSIS — Z124 Encounter for screening for malignant neoplasm of cervix: Secondary | ICD-10-CM | POA: Insufficient documentation

## 2014-01-28 MED ORDER — ESCITALOPRAM OXALATE 10 MG PO TABS: ORAL_TABLET | ORAL | Status: DC

## 2014-01-28 NOTE — Patient Instructions (Signed)
Keep a menstrual calendar. As long as less frequent or no menses and we will monitor. If prolonged or atypical bleeding then call.  You may obtain a copy of any labs that were done today by logging onto MyChart as outlined in the instructions provided with your AVS (after visit summary). The office will not call with normal lab results but certainly if there are any significant abnormalities then we will contact you.   Health Maintenance, Female A healthy lifestyle and preventative care can promote health and wellness.  Maintain regular health, dental, and eye exams.  Eat a healthy diet. Foods like vegetables, fruits, whole grains, low-fat dairy products, and lean protein foods contain the nutrients you need without too many calories. Decrease your intake of foods high in solid fats, added sugars, and salt. Get information about a proper diet from your caregiver, if necessary.  Regular physical exercise is one of the most important things you can do for your health. Most adults should get at least 150 minutes of moderate-intensity exercise (any activity that increases your heart rate and causes you to sweat) each week. In addition, most adults need muscle-strengthening exercises on 2 or more days a week.   Maintain a healthy weight. The body mass index (BMI) is a screening tool to identify possible weight problems. It provides an estimate of body fat based on height and weight. Your caregiver can help determine your BMI, and can help you achieve or maintain a healthy weight. For adults 20 years and older:  A BMI below 18.5 is considered underweight.  A BMI of 18.5 to 24.9 is normal.  A BMI of 25 to 29.9 is considered overweight.  A BMI of 30 and above is considered obese.  Maintain normal blood lipids and cholesterol by exercising and minimizing your intake of saturated fat. Eat a balanced diet with plenty of fruits and vegetables. Blood tests for lipids and cholesterol should begin at age 23  and be repeated every 5 years. If your lipid or cholesterol levels are high, you are over 50, or you are a high risk for heart disease, you may need your cholesterol levels checked more frequently.Ongoing high lipid and cholesterol levels should be treated with medicines if diet and exercise are not effective.  If you smoke, find out from your caregiver how to quit. If you do not use tobacco, do not start.  Lung cancer screening is recommended for adults aged 70 80 years who are at high risk for developing lung cancer because of a history of smoking. Yearly low-dose computed tomography (CT) is recommended for people who have at least a 30-pack-year history of smoking and are a current smoker or have quit within the past 15 years. A pack year of smoking is smoking an average of 1 pack of cigarettes a day for 1 year (for example: 1 pack a day for 30 years or 2 packs a day for 15 years). Yearly screening should continue until the smoker has stopped smoking for at least 15 years. Yearly screening should also be stopped for people who develop a health problem that would prevent them from having lung cancer treatment.  If you are pregnant, do not drink alcohol. If you are breastfeeding, be very cautious about drinking alcohol. If you are not pregnant and choose to drink alcohol, do not exceed 1 drink per day. One drink is considered to be 12 ounces (355 mL) of beer, 5 ounces (148 mL) of wine, or 1.5 ounces (44 mL) of  liquor.  Avoid use of street drugs. Do not share needles with anyone. Ask for help if you need support or instructions about stopping the use of drugs.  High blood pressure causes heart disease and increases the risk of stroke. Blood pressure should be checked at least every 1 to 2 years. Ongoing high blood pressure should be treated with medicines, if weight loss and exercise are not effective.  If you are 55 to 52 years old, ask your caregiver if you should take aspirin to prevent  strokes.  Diabetes screening involves taking a blood sample to check your fasting blood sugar level. This should be done once every 3 years, after age 45, if you are within normal weight and without risk factors for diabetes. Testing should be considered at a younger age or be carried out more frequently if you are overweight and have at least 1 risk factor for diabetes.  Breast cancer screening is essential preventative care for women. You should practice "breast self-awareness." This means understanding the normal appearance and feel of your breasts and may include breast self-examination. Any changes detected, no matter how small, should be reported to a caregiver. Women in their 20s and 30s should have a clinical breast exam (CBE) by a caregiver as part of a regular health exam every 1 to 3 years. After age 40, women should have a CBE every year. Starting at age 40, women should consider having a mammogram (breast X-ray) every year. Women who have a family history of breast cancer should talk to their caregiver about genetic screening. Women at a high risk of breast cancer should talk to their caregiver about having an MRI and a mammogram every year.  Breast cancer gene (BRCA)-related cancer risk assessment is recommended for women who have family members with BRCA-related cancers. BRCA-related cancers include breast, ovarian, tubal, and peritoneal cancers. Having family members with these cancers may be associated with an increased risk for harmful changes (mutations) in the breast cancer genes BRCA1 and BRCA2. Results of the assessment will determine the need for genetic counseling and BRCA1 and BRCA2 testing.  The Pap test is a screening test for cervical cancer. Women should have a Pap test starting at age 21. Between ages 21 and 29, Pap tests should be repeated every 2 years. Beginning at age 30, you should have a Pap test every 3 years as long as the past 3 Pap tests have been normal. If you had a  hysterectomy for a problem that was not cancer or a condition that could lead to cancer, then you no longer need Pap tests. If you are between ages 65 and 70, and you have had normal Pap tests going back 10 years, you no longer need Pap tests. If you have had past treatment for cervical cancer or a condition that could lead to cancer, you need Pap tests and screening for cancer for at least 20 years after your treatment. If Pap tests have been discontinued, risk factors (such as a new sexual partner) need to be reassessed to determine if screening should be resumed. Some women have medical problems that increase the chance of getting cervical cancer. In these cases, your caregiver may recommend more frequent screening and Pap tests.  The human papillomavirus (HPV) test is an additional test that may be used for cervical cancer screening. The HPV test looks for the virus that can cause the cell changes on the cervix. The cells collected during the Pap test can be tested for   HPV. The HPV test could be used to screen women aged 21 years and older, and should be used in women of any age who have unclear Pap test results. After the age of 30, women should have HPV testing at the same frequency as a Pap test.  Colorectal cancer can be detected and often prevented. Most routine colorectal cancer screening begins at the age of 85 and continues through age 86. However, your caregiver may recommend screening at an earlier age if you have risk factors for colon cancer. On a yearly basis, your caregiver may provide home test kits to check for hidden blood in the stool. Use of a small camera at the end of a tube, to directly examine the colon (sigmoidoscopy or colonoscopy), can detect the earliest forms of colorectal cancer. Talk to your caregiver about this at age 72, when routine screening begins. Direct examination of the colon should be repeated every 5 to 10 years through age 74, unless early forms of pre-cancerous  polyps or small growths are found.  Hepatitis C blood testing is recommended for all people born from [birth year] through 1964 and any individual with known risks for hepatitis C.  Practice safe sex. Use condoms and avoid high-risk sexual practices to reduce the spread of sexually transmitted infections (STIs). Sexually active women aged 46 and younger should be checked for Chlamydia, which is a common sexually transmitted infection. Older women with new or multiple partners should also be tested for Chlamydia. Testing for other STIs is recommended if you are sexually active and at increased risk.  Osteoporosis is a disease in which the bones lose minerals and strength with aging. This can result in serious bone fractures. The risk of osteoporosis can be identified using a bone density scan. Women ages 63 and over and women at risk for fractures or osteoporosis should discuss screening with their caregivers. Ask your caregiver whether you should be taking a calcium supplement or vitamin D to reduce the rate of osteoporosis.  Menopause can be associated with physical symptoms and risks. Hormone replacement therapy is available to decrease symptoms and risks. You should talk to your caregiver about whether hormone replacement therapy is right for you.  Use sunscreen. Apply sunscreen liberally and repeatedly throughout the day. You should seek shade when your shadow is shorter than you. Protect yourself by wearing long sleeves, pants, a wide-brimmed hat, and sunglasses year round, whenever you are outdoors.  Notify your caregiver of new moles or changes in moles, especially if there is a change in shape or color. Also notify your caregiver if a mole is larger than the size of a pencil eraser.  Stay current with your immunizations. Document Released: 01/23/2011 Document Revised: 11/04/2012 Document Reviewed: 01/23/2011 [facility] Patient Information 2014 [employer].

## 2014-01-28 NOTE — Progress Notes (Signed)
Coral CeoSusan W Kvamme 06/06/1961 [id number]        53 y.o.  G2P2002 for annual exam.  Several issues noted below.  Past medical history,surgical history, problem list, medications, allergies, family history and social history were all reviewed and documented as reviewed in the EPIC chart.  ROS:  12 system ROS performed with pertinent positives and negatives included in the history, assessment and plan.   Additional significant findings :  None   Exam: [facility] personassistant Filed Vitals:   01/28/14 1616  BP: 116/74  Height: 5\' 5"  (1.651 m)  Weight: 122 lb (55.339 kg)   General appearance:  Normal affect, orientation and appearance. Skin: Grossly normal HEENT: Without gross lesions.  No cervical or supraclavicular adenopathy. Thyroid normal.  Lungs:  Clear without wheezing, rales or rhonchi Cardiac: RR, without RMG Abdominal:  Soft, nontender, without masses, guarding, rebound, organomegaly or hernia Breasts:  Examined lying and sitting without masses, retractions, discharge or axillary adenopathy. Pelvic:  Ext/BUS/vagina normal  Cervix normal. Pap/HPV  Uterus anteverted, normal size, shape and contour, midline and mobile nontender   Adnexa  Without masses or tenderness    Anus and perineum  Normal   Rectovaginal  Normal sphincter tone without palpated masses or tenderness.    Assessment/Plan:  53 y.o. [id number] female for annual exam.   1. Perimenopausal. Patient's LMP 10/22/2013.  Having some mild hot flushes and night sweats. Transiently tried HRT last year. Then was going to try low-dose oral contraceptives. Ultimately has not done this and is doing well from a symptom standpoint. Has not had any bleeding since her LMP. Prefers not to take anything at this time. Will keep menstrual calendar. As long as less frequent but regular and will follow. If prolonged or atypical bleeding or goes more than 1 year and then has bleeding she knows to call for evaluation. At this point she wants to monitor her  symptoms as they are acceptable. If she does develop unacceptable hot flushes night sweats or other symptoms then will discuss HRT. 2. Pap smear 2012. History of LGSIL 2006. Pap/HPV today. Repeat Pap 3-5 year interval assuming negative. 3. Mammography 07/2013. Continue with annual mammography. SBE monthly reviewed. 4. Colonoscopy 2014. Repeat at their recommended interval. 5. DEXA never. We'll plan further into the menopause. Increase calcium vitamin D. Check vitamin D level today. 6. Anxiety. Patient continues on Lexapro 10 mg daily. Had tried stopping previously with recurrence of her symptoms. Is doing well on this and I refilled her x1 year. 7. Health maintenance. Baseline CBC comprehensive metabolic panel lipid profile TSH vitamin D urinalysis ordered. Followup in one year, sooner as needed.   Note: This document was prepared with digital dictation and possible smart phrase technology. Any transcriptional errors that result from this process are unintentional.   Dara LordsFONTAINE,Mohamud Mrozek P MD, 5:04 PM 01/28/2014

## 2014-01-29 ENCOUNTER — Encounter: Payer: Self-pay | Admitting: Gynecology

## 2014-01-29 ENCOUNTER — Other Ambulatory Visit: Payer: Self-pay | Admitting: Gynecology

## 2014-01-29 DIAGNOSIS — E7800 Pure hypercholesterolemia, unspecified: Secondary | ICD-10-CM

## 2014-01-29 LAB — URINALYSIS W MICROSCOPIC + REFLEX CULTURE
BACTERIA UA: NONE SEEN
Bilirubin Urine: NEGATIVE
CASTS: NONE SEEN
CRYSTALS: NONE SEEN
Glucose, UA: NEGATIVE mg/dL
Hgb urine dipstick: NEGATIVE
KETONES UR: NEGATIVE mg/dL
Leukocytes, UA: NEGATIVE
NITRITE: NEGATIVE
PH: 7 (ref 5.0–8.0)
Protein, ur: NEGATIVE mg/dL
SPECIFIC GRAVITY, URINE: 1.015 (ref 1.005–1.030)
Squamous Epithelial / LPF: NONE SEEN
Urobilinogen, UA: 1 mg/dL (ref 0.0–1.0)

## 2014-01-29 LAB — COMPREHENSIVE METABOLIC PANEL
ALBUMIN: 4.6 g/dL (ref 3.5–5.2)
ALK PHOS: 63 U/L (ref 39–117)
ALT: 13 U/L (ref 0–35)
AST: 19 U/L (ref 0–37)
BUN: 21 mg/dL (ref 6–23)
CALCIUM: 9.1 mg/dL (ref 8.4–10.5)
CHLORIDE: 102 meq/L (ref 96–112)
CO2: 29 meq/L (ref 19–32)
Creat: 0.77 mg/dL (ref 0.50–1.10)
GLUCOSE: 85 mg/dL (ref 70–99)
Potassium: 3.5 mEq/L (ref 3.5–5.3)
SODIUM: 138 meq/L (ref 135–145)
Total Bilirubin: 0.3 mg/dL (ref 0.2–1.2)
Total Protein: 6.9 g/dL (ref 6.0–8.3)

## 2014-01-29 LAB — CBC WITH DIFFERENTIAL/PLATELET
BASOS ABS: 0.1 10*3/uL (ref 0.0–0.1)
BASOS PCT: 1 % (ref 0–1)
EOS ABS: 0.4 10*3/uL (ref 0.0–0.7)
EOS PCT: 6 % — AB (ref 0–5)
HCT: 39 % (ref 36.0–46.0)
Hemoglobin: 12.9 g/dL (ref 12.0–15.0)
LYMPHS PCT: 32 % (ref 12–46)
Lymphs Abs: 2 10*3/uL (ref 0.7–4.0)
MCH: 29.8 pg (ref 26.0–34.0)
MCHC: 33.1 g/dL (ref 30.0–36.0)
MCV: 90.1 fL (ref 78.0–100.0)
Monocytes Absolute: 0.6 10*3/uL (ref 0.1–1.0)
Monocytes Relative: 9 % (ref 3–12)
Neutro Abs: 3.2 10*3/uL (ref 1.7–7.7)
Neutrophils Relative %: 52 % (ref 43–77)
PLATELETS: 353 10*3/uL (ref 150–400)
RBC: 4.33 MIL/uL (ref 3.87–5.11)
RDW: 13.2 % (ref 11.5–15.5)
WBC: 6.2 10*3/uL (ref 4.0–10.5)

## 2014-01-29 LAB — LIPID PANEL
CHOLESTEROL: 273 mg/dL — AB (ref 0–200)
HDL: 91 mg/dL (ref >39)
LDL CALC: 154 mg/dL — AB (ref 0–99)
TRIGLYCERIDES: 138 mg/dL (ref <150)
Total CHOL/HDL Ratio: 3 Ratio
VLDL: 28 mg/dL (ref 0–40)

## 2014-01-29 LAB — TSH: TSH: 1.793 u[IU]/mL (ref 0.350–4.500)

## 2014-01-29 LAB — VITAMIN D 25 HYDROXY (VIT D DEFICIENCY, FRACTURES): Vit D, 25-Hydroxy: 54 ng/mL (ref 30–89)

## 2014-01-30 LAB — CYTOLOGY - PAP

## 2014-02-02 ENCOUNTER — Other Ambulatory Visit: Payer: BC Managed Care – PPO

## 2014-03-15 ENCOUNTER — Other Ambulatory Visit: Payer: Self-pay | Admitting: Gynecology

## 2014-05-25 ENCOUNTER — Encounter: Payer: Self-pay | Admitting: Gynecology

## 2014-07-29 ENCOUNTER — Other Ambulatory Visit: Payer: Self-pay

## 2014-07-29 DIAGNOSIS — Z1231 Encounter for screening mammogram for malignant neoplasm of breast: Secondary | ICD-10-CM

## 2014-08-18 ENCOUNTER — Ambulatory Visit
Admission: RE | Admit: 2014-08-18 | Discharge: 2014-08-18 | Disposition: A | Discharge status: Home / Self Care | Payer: BLUE CROSS/BLUE SHIELD | Source: Ambulatory Visit

## 2014-08-18 DIAGNOSIS — Z1231 Encounter for screening mammogram for malignant neoplasm of breast: Secondary | ICD-10-CM

## 2015-02-11 ENCOUNTER — Other Ambulatory Visit: Payer: Self-pay | Admitting: Gynecology

## 2015-02-25 ENCOUNTER — Encounter: Payer: Self-pay | Admitting: Gynecology

## 2015-04-01 ENCOUNTER — Encounter: Payer: Self-pay | Admitting: Gynecology

## 2015-04-14 ENCOUNTER — Other Ambulatory Visit: Payer: Self-pay | Admitting: Gynecology

## 2015-04-14 NOTE — Telephone Encounter (Signed)
CE is scheduled 05/26/15.

## 2015-05-26 ENCOUNTER — Ambulatory Visit (INDEPENDENT_AMBULATORY_CARE_PROVIDER_SITE_OTHER): Payer: BLUE CROSS/BLUE SHIELD | Admitting: Gynecology

## 2015-05-26 ENCOUNTER — Encounter: Payer: Self-pay | Admitting: Gynecology

## 2015-05-26 VITALS — BP 110/70 | Ht 66.0 in | Wt 122.0 lb

## 2015-05-26 DIAGNOSIS — Z23 Encounter for immunization: Secondary | ICD-10-CM | POA: Diagnosis not present

## 2015-05-26 DIAGNOSIS — Z01419 Encounter for gynecological examination (general) (routine) without abnormal findings: Secondary | ICD-10-CM

## 2015-05-26 LAB — COMPREHENSIVE METABOLIC PANEL
ALBUMIN: 4.5 g/dL (ref 3.6–5.1)
ALT: 12 U/L (ref 6–29)
AST: 19 U/L (ref 10–35)
Alkaline Phosphatase: 74 U/L (ref 33–130)
BILIRUBIN TOTAL: 0.4 mg/dL (ref 0.2–1.2)
BUN: 20 mg/dL (ref 7–25)
CALCIUM: 9 mg/dL (ref 8.6–10.4)
CO2: 29 mmol/L (ref 20–31)
Chloride: 99 mmol/L (ref 98–110)
Creat: 0.79 mg/dL (ref 0.50–1.05)
GLUCOSE: 79 mg/dL (ref 65–99)
POTASSIUM: 4 mmol/L (ref 3.5–5.3)
Sodium: 138 mmol/L (ref 135–146)
Total Protein: 6.8 g/dL (ref 6.1–8.1)

## 2015-05-26 LAB — CBC WITH DIFFERENTIAL/PLATELET
Basophils Absolute: 0 10*3/uL (ref 0.0–0.1)
Basophils Relative: 0 % (ref 0–1)
EOS PCT: 4 % (ref 0–5)
Eosinophils Absolute: 0.3 10*3/uL (ref 0.0–0.7)
HEMATOCRIT: 39.4 % (ref 36.0–46.0)
HEMOGLOBIN: 12.9 g/dL (ref 12.0–15.0)
LYMPHS ABS: 1.8 10*3/uL (ref 0.7–4.0)
LYMPHS PCT: 25 % (ref 12–46)
MCH: 29.3 pg (ref 26.0–34.0)
MCHC: 32.7 g/dL (ref 30.0–36.0)
MCV: 89.5 fL (ref 78.0–100.0)
MONO ABS: 0.9 10*3/uL (ref 0.1–1.0)
MONOS PCT: 12 % (ref 3–12)
MPV: 9.8 fL (ref 8.6–12.4)
NEUTROS ABS: 4.2 10*3/uL (ref 1.7–7.7)
NEUTROS PCT: 59 % (ref 43–77)
Platelets: 385 10*3/uL (ref 150–400)
RBC: 4.4 MIL/uL (ref 3.87–5.11)
RDW: 13.4 % (ref 11.5–15.5)
WBC: 7.1 10*3/uL (ref 4.0–10.5)

## 2015-05-26 LAB — LIPID PANEL
CHOL/HDL RATIO: 2.9 ratio (ref <=5.0)
Cholesterol: 283 mg/dL — ABNORMAL HIGH (ref 125–200)
HDL: 96 mg/dL (ref >=46)
LDL Cholesterol: 173 mg/dL — ABNORMAL HIGH (ref <130)
TRIGLYCERIDES: 71 mg/dL (ref <150)
VLDL: 14 mg/dL (ref <30)

## 2015-05-26 LAB — TSH: TSH: 1.817 u[IU]/mL (ref 0.350–4.500)

## 2015-05-26 MED ORDER — ESCITALOPRAM OXALATE 10 MG PO TABS: 10.0000 mg | ORAL_TABLET | Freq: Every day | ORAL | Status: DC

## 2015-05-26 NOTE — Patient Instructions (Signed)

## 2015-05-26 NOTE — Addendum Note (Signed)
Addended by: Dayna BarkerGARDNER, KIMBERLY K on: 05/26/2015 12:41 PM   Modules accepted: Orders

## 2015-05-26 NOTE — Progress Notes (Signed)
Virginia Hanna 12/30/1960 [id number]        54 y.o.  G2P2002  Patient's last menstrual period was 03/08/2015. for annual exam.  Several issues noted below  Past medical history,surgical history, problem list, medications, allergies, family history and social history were all reviewed and documented as reviewed in the EPIC chart.  ROS:  Performed with pertinent positives and negatives included in the history, assessment and plan.   Additional significant findings :  none   Exam: [facility] personassistant Filed Vitals:   05/26/15 1206  BP: 110/70  Height: 5\' 6"  (1.676 m)  Weight: 122 lb (55.339 kg)   General appearance:  Normal affect, orientation and appearance. Skin: Grossly normal HEENT: Without gross lesions.  No cervical or supraclavicular adenopathy. Thyroid normal.  Lungs:  Clear without wheezing, rales or rhonchi Cardiac: RR, without RMG Abdominal:  Soft, nontender, without masses, guarding, rebound, organomegaly or hernia Breasts:  Examined lying and sitting without masses, retractions, discharge or axillary adenopathy. Pelvic:  Ext/BUS/vagina normal  Cervix normal  Uterus anteverted, normal size, shape and contour, midline and mobile nontender   Adnexa  Without masses or tenderness    Anus and perineum  Normal   Rectovaginal  Normal sphincter tone without palpated masses or tenderness.    Assessment/Plan:  54 y.o. 482P2002 female for annual exam occasional menses, vasectomy birth control.   1. Irregular menses. Patient has 2-3 menses per year. No significant hot flushes, night sweats or vaginal dryness. We'll continue to monitor and patient knows to report any prolonged or atypical bleeding or she goes more than one year without bleeding and then has a bleeding episode. 2. Pap smear/HPV 2015 negative. No Pap smear done today. History of LGSIL 2006 with negative Pap smears after. 3. Mammography 07/2014. Continue with annual mammography and repeat when due. SBE monthly  reviewed. 4. Colonoscopy 2014. Repeat at their recommended interval. 5. Anxiety. Patient continues on Lexapro 10 mg daily doing well with this. Refill 1 year provided. 6. DEXA never. We will plan further into the menopause. Increase calcium vitamin D reviewed. Check vitamin D level today. 7. Health maintenance. Baseline CBC, comprehensive metabolic panel, lipid profile, TSH, vitamin D, urinalysis ordered. Follow up in one year, sooner as needed.   Dara LordsFONTAINE,TIMOTHY P MD, 12:26 PM 05/26/2015

## 2015-05-27 LAB — URINALYSIS W MICROSCOPIC + REFLEX CULTURE
BACTERIA UA: NONE SEEN [HPF]
Bilirubin Urine: NEGATIVE
Casts: NONE SEEN [LPF]
Crystals: NONE SEEN [HPF]
GLUCOSE, UA: NEGATIVE
HGB URINE DIPSTICK: NEGATIVE
KETONES UR: NEGATIVE
LEUKOCYTES UA: NEGATIVE
Nitrite: NEGATIVE
PROTEIN: NEGATIVE
RBC / HPF: NONE SEEN RBC/HPF (ref <=2)
SQUAMOUS EPITHELIAL / LPF: NONE SEEN [HPF] (ref <=5)
Specific Gravity, Urine: 1.015 (ref 1.001–1.035)
WBC, UA: NONE SEEN WBC/HPF (ref <=5)
YEAST: NONE SEEN [HPF]
pH: 5 (ref 5.0–8.0)

## 2015-05-27 LAB — VITAMIN D 25 HYDROXY (VIT D DEFICIENCY, FRACTURES): Vit D, 25-Hydroxy: 36 ng/mL (ref 30–100)

## 2015-07-16 ENCOUNTER — Other Ambulatory Visit: Payer: Self-pay

## 2015-07-16 DIAGNOSIS — Z1231 Encounter for screening mammogram for malignant neoplasm of breast: Secondary | ICD-10-CM

## 2015-08-05 ENCOUNTER — Other Ambulatory Visit: Payer: Self-pay | Admitting: Gynecology

## 2015-08-20 ENCOUNTER — Ambulatory Visit
Admission: RE | Admit: 2015-08-20 | Discharge: 2015-08-20 | Disposition: A | Discharge status: Home / Self Care | Payer: BLUE CROSS/BLUE SHIELD | Source: Ambulatory Visit

## 2015-08-20 DIAGNOSIS — Z1231 Encounter for screening mammogram for malignant neoplasm of breast: Secondary | ICD-10-CM

## 2015-11-02 DIAGNOSIS — Z780 Asymptomatic menopausal state: Secondary | ICD-10-CM | POA: Diagnosis not present

## 2016-03-21 DIAGNOSIS — D171 Benign lipomatous neoplasm of skin and subcutaneous tissue of trunk: Secondary | ICD-10-CM | POA: Diagnosis not present

## 2016-04-12 DIAGNOSIS — D1720 Benign lipomatous neoplasm of skin and subcutaneous tissue of unspecified limb: Secondary | ICD-10-CM | POA: Diagnosis not present

## 2016-05-15 ENCOUNTER — Other Ambulatory Visit: Payer: Self-pay | Admitting: General Surgery

## 2016-05-15 DIAGNOSIS — D1722 Benign lipomatous neoplasm of skin and subcutaneous tissue of left arm: Secondary | ICD-10-CM | POA: Diagnosis not present

## 2016-05-15 DIAGNOSIS — D1779 Benign lipomatous neoplasm of other sites: Secondary | ICD-10-CM | POA: Diagnosis not present

## 2016-06-20 DIAGNOSIS — H10413 Chronic giant papillary conjunctivitis, bilateral: Secondary | ICD-10-CM | POA: Diagnosis not present

## 2016-06-20 DIAGNOSIS — H04123 Dry eye syndrome of bilateral lacrimal glands: Secondary | ICD-10-CM | POA: Diagnosis not present

## 2016-06-26 DIAGNOSIS — L309 Dermatitis, unspecified: Secondary | ICD-10-CM | POA: Diagnosis not present

## 2016-06-30 DIAGNOSIS — L309 Dermatitis, unspecified: Secondary | ICD-10-CM | POA: Diagnosis not present

## 2016-07-01 ENCOUNTER — Other Ambulatory Visit: Payer: Self-pay | Admitting: Gynecology

## 2016-07-05 DIAGNOSIS — L239 Allergic contact dermatitis, unspecified cause: Secondary | ICD-10-CM | POA: Diagnosis not present

## 2016-07-14 DIAGNOSIS — J069 Acute upper respiratory infection, unspecified: Secondary | ICD-10-CM | POA: Diagnosis not present

## 2016-07-26 ENCOUNTER — Other Ambulatory Visit: Payer: Self-pay | Admitting: Gynecology

## 2016-07-26 DIAGNOSIS — Z1231 Encounter for screening mammogram for malignant neoplasm of breast: Secondary | ICD-10-CM

## 2016-08-03 ENCOUNTER — Ambulatory Visit (INDEPENDENT_AMBULATORY_CARE_PROVIDER_SITE_OTHER): Payer: BLUE CROSS/BLUE SHIELD | Admitting: Gynecology

## 2016-08-03 ENCOUNTER — Encounter: Payer: Self-pay | Admitting: Gynecology

## 2016-08-03 VITALS — BP 110/60 | Ht 65.0 in | Wt 128.0 lb

## 2016-08-03 DIAGNOSIS — Z1329 Encounter for screening for other suspected endocrine disorder: Secondary | ICD-10-CM | POA: Diagnosis not present

## 2016-08-03 DIAGNOSIS — Z13220 Encounter for screening for lipoid disorders: Secondary | ICD-10-CM | POA: Diagnosis not present

## 2016-08-03 DIAGNOSIS — Z01419 Encounter for gynecological examination (general) (routine) without abnormal findings: Secondary | ICD-10-CM | POA: Diagnosis not present

## 2016-08-03 DIAGNOSIS — Z1321 Encounter for screening for nutritional disorder: Secondary | ICD-10-CM | POA: Diagnosis not present

## 2016-08-03 LAB — URINALYSIS W MICROSCOPIC + REFLEX CULTURE
Bacteria, UA: NONE SEEN [HPF]
Bilirubin Urine: NEGATIVE
Casts: NONE SEEN [LPF]
Glucose, UA: NEGATIVE
Hgb urine dipstick: NEGATIVE
Ketones, ur: NEGATIVE
Leukocytes, UA: NEGATIVE
Nitrite: NEGATIVE
Protein, ur: NEGATIVE
RBC / HPF: NONE SEEN RBC/HPF
Specific Gravity, Urine: 1.026 (ref 1.001–1.035)
Yeast: NONE SEEN [HPF]
pH: 5.5 (ref 5.0–8.0)

## 2016-08-03 MED ORDER — ESCITALOPRAM OXALATE 10 MG PO TABS: 10.0000 mg | ORAL_TABLET | Freq: Every day | ORAL | 12 refills | Status: DC

## 2016-08-03 NOTE — Patient Instructions (Signed)

## 2016-08-03 NOTE — Progress Notes (Signed)
    Virginia Hanna 09/27/1960 [id number]        56 y.o.  G2P2002 for annual exam.    Past medical history,surgical history, problem list, medications, allergies, family history and social history were all reviewed and documented as reviewed in the EPIC chart.  ROS:  Performed with pertinent positives and negatives included in the history, assessment and plan.   Additional significant findings :  None   Exam: Virginia PortelaKim Hanna assistant Vitals:   08/03/16 0800  BP: 110/60  Weight: 128 lb (58.1 kg)  Height: 5\' 5"  (1.651 m)   Body mass index is 21.3 kg/m.  General appearance:  Normal affect, orientation and appearance. Skin: Grossly normal HEENT: Without gross lesions.  No cervical or supraclavicular adenopathy. Thyroid normal.  Lungs:  Clear without wheezing, rales or rhonchi Cardiac: RR, without RMG Abdominal:  Soft, nontender, without masses, guarding, rebound, organomegaly or hernia Breasts:  Examined lying and sitting without masses, retractions, discharge or axillary adenopathy. Pelvic:  Ext, BUS, Vagina with mild atrophic changes   Cervix normal  Uterus anteverted, normal size, shape and contour, midline and mobile nontender   Adnexa without masses or tenderness    Anus and perineum normal   Rectovaginal normal sphincter tone without palpated masses or tenderness.    Assessment/Plan:  56 y.o. 462P2002 female for annual exam.   1. Postmenopausal/mild atrophic changes. LMP 02/2015. Without significant hot flushes, night sweats, vaginal dryness or any vaginal bleeding. Continue to monitor and report any issues or bleeding. 2. Pap smear/HPV 2015 negative. No Pap smear done today. History of LGSIL 2006 with normal Pap smears after. 3. Mammography scheduled and patient will follow up for this. SBE monthly reviewed. 4. Colonoscopy 2014. Repeat at their recommended interval. 5. DEXA never. Will plan further into menopause. Check vitamin D level. 6. Anxiety. Continues on Lexapro 10 mg  daily doing well and wants to continue. Refill 1 year provided. 7. Health maintenance. Patient's going to return fasting for CBC, CMP, lipid profile, vitamin D, TSH.  Urinalysis checked today. Follow up 1 year, sooner as needed.   [place] Hanna,[name] P MD, 8:23 AM 08/03/2016

## 2016-08-05 LAB — URINE CULTURE

## 2016-08-18 ENCOUNTER — Other Ambulatory Visit: Payer: BLUE CROSS/BLUE SHIELD

## 2016-08-18 DIAGNOSIS — Z13220 Encounter for screening for lipoid disorders: Secondary | ICD-10-CM

## 2016-08-18 DIAGNOSIS — Z1329 Encounter for screening for other suspected endocrine disorder: Secondary | ICD-10-CM | POA: Diagnosis not present

## 2016-08-18 DIAGNOSIS — Z1321 Encounter for screening for nutritional disorder: Secondary | ICD-10-CM | POA: Diagnosis not present

## 2016-08-18 DIAGNOSIS — Z01419 Encounter for gynecological examination (general) (routine) without abnormal findings: Secondary | ICD-10-CM | POA: Diagnosis not present

## 2016-08-18 LAB — CBC WITH DIFFERENTIAL/PLATELET
BASOS PCT: 1 %
Basophils Absolute: 47 cells/uL (ref 0–200)
EOS ABS: 235 {cells}/uL (ref 15–500)
Eosinophils Relative: 5 %
HEMATOCRIT: 40.2 % (ref 35.0–45.0)
HEMOGLOBIN: 13.5 g/dL (ref 11.7–15.5)
Lymphocytes Relative: 28 %
Lymphs Abs: 1316 cells/uL (ref 850–3900)
MCH: 30.8 pg (ref 27.0–33.0)
MCHC: 33.6 g/dL (ref 32.0–36.0)
MCV: 91.6 fL (ref 80.0–100.0)
MONO ABS: 564 {cells}/uL (ref 200–950)
MONOS PCT: 12 %
MPV: 9.6 fL (ref 7.5–12.5)
NEUTROS ABS: 2538 {cells}/uL (ref 1500–7800)
Neutrophils Relative %: 54 %
PLATELETS: 339 10*3/uL (ref 140–400)
RBC: 4.39 MIL/uL (ref 3.80–5.10)
RDW: 13.5 % (ref 11.0–15.0)
WBC: 4.7 10*3/uL (ref 3.8–10.8)

## 2016-08-18 LAB — TSH: TSH: 2.36 mIU/L

## 2016-08-19 LAB — LIPID PANEL
CHOL/HDL RATIO: 2.8 ratio (ref <5.0)
CHOLESTEROL: 271 mg/dL — AB (ref <200)
HDL: 97 mg/dL (ref >50)
LDL Cholesterol: 162 mg/dL — ABNORMAL HIGH (ref <100)
Triglycerides: 62 mg/dL (ref <150)
VLDL: 12 mg/dL (ref <30)

## 2016-08-19 LAB — COMPREHENSIVE METABOLIC PANEL
ALK PHOS: 63 U/L (ref 33–130)
ALT: 15 U/L (ref 6–29)
AST: 23 U/L (ref 10–35)
Albumin: 4.2 g/dL (ref 3.6–5.1)
BUN: 13 mg/dL (ref 7–25)
CALCIUM: 9.2 mg/dL (ref 8.6–10.4)
CO2: 23 mmol/L (ref 20–31)
Chloride: 103 mmol/L (ref 98–110)
Creat: 0.79 mg/dL (ref 0.50–1.05)
Glucose, Bld: 84 mg/dL (ref 65–99)
Potassium: 4.1 mmol/L (ref 3.5–5.3)
Sodium: 140 mmol/L (ref 135–146)
Total Bilirubin: 0.4 mg/dL (ref 0.2–1.2)
Total Protein: 6.5 g/dL (ref 6.1–8.1)

## 2016-08-19 LAB — VITAMIN D 25 HYDROXY (VIT D DEFICIENCY, FRACTURES): Vit D, 25-Hydroxy: 32 ng/mL (ref 30–100)

## 2016-08-21 ENCOUNTER — Ambulatory Visit: Payer: BLUE CROSS/BLUE SHIELD

## 2016-09-14 ENCOUNTER — Ambulatory Visit: Payer: BLUE CROSS/BLUE SHIELD

## 2016-10-03 ENCOUNTER — Ambulatory Visit: Payer: BLUE CROSS/BLUE SHIELD

## 2016-10-11 ENCOUNTER — Ambulatory Visit: Payer: BLUE CROSS/BLUE SHIELD

## 2016-10-27 ENCOUNTER — Inpatient Hospital Stay: Admission: RE | Admit: 2016-10-27 | Payer: BLUE CROSS/BLUE SHIELD | Source: Ambulatory Visit

## 2016-11-02 ENCOUNTER — Telehealth: Payer: Self-pay | Admitting: *Deleted

## 2016-11-02 NOTE — Telephone Encounter (Signed)
Pt called c/o vaginal dryness with sexual intercourse states she forgot to mention this at annual in Jan. Has tried OTC KY jelly and olive oil and not relief, states KY burns and olive oil just didn't help. Pt asked if you have any other options? Please advise

## 2016-11-02 NOTE — Telephone Encounter (Signed)
Left message for pt to call.

## 2016-11-02 NOTE — Telephone Encounter (Signed)
In the absence of other symptoms to suggest infection the most likely due to lack of estrogen. You can try a different oil-based lubricant such as coconut oil or other alternatives could be vaginal estrogen supplementation. That discussion is better had in person

## 2016-11-03 ENCOUNTER — Encounter: Payer: Self-pay | Admitting: *Deleted

## 2016-11-03 NOTE — Telephone Encounter (Signed)
Sent pt a my chart message regarding the below.

## 2016-11-15 ENCOUNTER — Ambulatory Visit: Payer: BLUE CROSS/BLUE SHIELD

## 2016-11-22 ENCOUNTER — Ambulatory Visit: Payer: BLUE CROSS/BLUE SHIELD

## 2016-12-01 ENCOUNTER — Ambulatory Visit
Admission: RE | Admit: 2016-12-01 | Discharge: 2016-12-01 | Disposition: A | Discharge status: Home / Self Care | Payer: BLUE CROSS/BLUE SHIELD | Source: Ambulatory Visit | Attending: Gynecology | Admitting: Gynecology

## 2016-12-01 DIAGNOSIS — Z1231 Encounter for screening mammogram for malignant neoplasm of breast: Secondary | ICD-10-CM

## 2017-01-02 DIAGNOSIS — E7800 Pure hypercholesterolemia, unspecified: Secondary | ICD-10-CM | POA: Diagnosis not present

## 2017-05-07 DIAGNOSIS — Z0289 Encounter for other administrative examinations: Secondary | ICD-10-CM

## 2017-06-04 DIAGNOSIS — H0100A Unspecified blepharitis right eye, upper and lower eyelids: Secondary | ICD-10-CM | POA: Diagnosis not present

## 2017-06-04 DIAGNOSIS — H0100B Unspecified blepharitis left eye, upper and lower eyelids: Secondary | ICD-10-CM | POA: Diagnosis not present

## 2017-06-04 DIAGNOSIS — H04123 Dry eye syndrome of bilateral lacrimal glands: Secondary | ICD-10-CM | POA: Diagnosis not present

## 2017-08-09 ENCOUNTER — Encounter: Payer: Self-pay | Admitting: Gynecology

## 2017-08-09 ENCOUNTER — Ambulatory Visit (INDEPENDENT_AMBULATORY_CARE_PROVIDER_SITE_OTHER): Payer: BLUE CROSS/BLUE SHIELD | Admitting: Gynecology

## 2017-08-09 VITALS — BP 120/76 | Ht 64.75 in | Wt 118.0 lb

## 2017-08-09 DIAGNOSIS — N952 Postmenopausal atrophic vaginitis: Secondary | ICD-10-CM

## 2017-08-09 DIAGNOSIS — Z01411 Encounter for gynecological examination (general) (routine) with abnormal findings: Secondary | ICD-10-CM

## 2017-08-09 MED ORDER — ESCITALOPRAM OXALATE 10 MG PO TABS: 10.0000 mg | ORAL_TABLET | Freq: Every day | ORAL | 12 refills | Status: DC

## 2017-08-09 NOTE — Progress Notes (Signed)
    Virginia Hanna 05/04/1961 [id number]        57 y.o.  [id number] for annual gynecologic exam.  Doing well without gynecologic complaints  Past medical history,surgical history, problem list, medications, allergies, family history and social history were all reviewed and documented as reviewed in the EPIC chart.  ROS:  Performed with pertinent positives and negatives included in the history, assessment and plan.   Additional significant findings : None   Exam: Kennon PortelaKim Gardner assistant Vitals:   08/09/17 0818  BP: 120/76  Weight: 118 lb (53.5 kg)  Height: 5' 4.75" (1.645 m)   Body mass index is 19.79 kg/m.  General appearance:  Normal affect, orientation and appearance. Skin: Grossly normal HEENT: Without gross lesions.  No cervical or supraclavicular adenopathy. Thyroid normal.  Lungs:  Clear without wheezing, rales or rhonchi Cardiac: RR, without RMG Abdominal:  Soft, nontender, without masses, guarding, rebound, organomegaly or hernia Breasts:  Examined lying and sitting without masses, retractions, discharge or axillary adenopathy. Pelvic:  Ext, BUS, Vagina: Normal with mild atrophic changes  Cervix: Normal with mild atrophic changes  Uterus: Anteverted, normal size, shape and contour, midline and mobile nontender   Adnexa: Without masses or tenderness    Anus and perineum: Normal   Rectovaginal: Normal sphincter tone without palpated masses or tenderness.    Assessment/Plan:  57 y.o. 592P2002 female for annual gynecologic exam.   1. Postmenopausal/mild atrophic changes.  No significant hot flushes, night sweats, vaginal dryness or bleeding.  Continue to monitor and report any issues or bleeding. 2. Pap smear/HPV 2015.  No Pap smear done today.  History of LGSIL 2006 with normal Pap smears afterwards.  Plan repeat Pap smear at 5-year interval per current screening guidelines. 3. Mammography 11/2016.  Continue with annual mammography when due.  SBE monthly reviewed.  Breast exam  normal today. 4. Colonoscopy 2014.  Repeat at their recommended interval. 5. DEXA never.  Will plan further into the menopause.  On vitamin D supplementation. 6. Anxiety.  Continues on Lexapro 10 mg daily doing well.  Refill times 1 year provided. 7. Health maintenance.  No routine lab work done as patient is going to have this done at her primary physician's office.  Followed for elevated cholesterol in the 270 range and LDL in the 160 range.  I encouraged her to discuss treatment with her primary physician and she agrees to do so.  Follow-up in 1 year, sooner as needed.   Dara Lordsimothy P Generoso Cropper MD, 9:00 AM 08/09/2017

## 2017-08-09 NOTE — Patient Instructions (Signed)
Follow-up in 1 year for annual exam, sooner if any issues. 

## 2017-08-27 ENCOUNTER — Other Ambulatory Visit: Payer: Self-pay | Admitting: Gynecology

## 2017-10-19 ENCOUNTER — Ambulatory Visit (INDEPENDENT_AMBULATORY_CARE_PROVIDER_SITE_OTHER): Payer: BLUE CROSS/BLUE SHIELD | Admitting: Internal Medicine

## 2017-10-19 ENCOUNTER — Encounter: Payer: Self-pay | Admitting: Internal Medicine

## 2017-10-19 DIAGNOSIS — Z23 Encounter for immunization: Secondary | ICD-10-CM

## 2017-10-19 DIAGNOSIS — Z0000 Encounter for general adult medical examination without abnormal findings: Secondary | ICD-10-CM

## 2017-10-19 DIAGNOSIS — Z7189 Other specified counseling: Secondary | ICD-10-CM

## 2017-10-19 DIAGNOSIS — Z7184 Encounter for health counseling related to travel: Secondary | ICD-10-CM | POA: Insufficient documentation

## 2017-10-19 DIAGNOSIS — Z7185 Encounter for immunization safety counseling: Secondary | ICD-10-CM

## 2017-10-19 DIAGNOSIS — Z9189 Other specified personal risk factors, not elsewhere classified: Secondary | ICD-10-CM

## 2017-10-19 DIAGNOSIS — Z789 Other specified health status: Secondary | ICD-10-CM

## 2017-10-19 MED ORDER — ATOVAQUONE-PROGUANIL HCL 250-100 MG PO TABS: 1.0000 | ORAL_TABLET | Freq: Every day | ORAL | 0 refills | Status: DC

## 2017-10-19 MED ORDER — AZITHROMYCIN 500 MG PO TABS: 1000.0000 mg | ORAL_TABLET | Freq: Once | ORAL | 0 refills | Status: AC

## 2017-10-19 NOTE — Patient Instructions (Signed)
[facility]                [address], [address]                   [place], [place] [id number]                      Phone: [phone]                        Fax: [phone]   Planned departure date: May 2019          Countries of travel: [place]   Guidelines for the Prevention & Treatment of Traveler's Diarrhea  Prevention: "Boil it, Peel it, Paradiseook it, or Forget it"   the fewer chances -> lower risk: try to stick to food & water precautions as much as possible"   If it's "piping hot"; it is probably okay, if not, it may not be   Treatment   1) You should always take care to drink lots of fluids in order to avoid dehydration   2) You should bring medications with you in case you come down with a case of diarrhea   3) OTC = bring pepto-bismol - can take with initial abdominal symptoms;                    Imodium - can help slow down your intestinal tract, can help relief cramps                    and diarrhea, can take if no bloody diarrhea  Use azithromycin if needed for traveler's diarrhea  Guidelines for the Prevention of Malaria  Avoidance:  -fewer mosquito bites = lower risk. Mosquitos can bite at night as well as daytime  -cover up (long sleeve clothing), mosquito nets, screens  -Insect repellent for your skin ( DEET containing lotion > 20%): for clothes ( permethrin spray)   2 days prior to entering a malarious area, start malarone, daily dose starting 1-2 days before entering endemic area, ending 7 days after leaving area for malaria prevention.   Immunizations received today: Hepatitis A series, Td and Typhoid (parenteral)  Future immunizations, if indicated Hepatitis A series in 6 months, between April 22, 2018 and October 21 2018   Prior to travel:  1) Be sure to pick up appropriate prescriptions, including medicine you take daily. Do not expect to be able to fill your prescriptions  abroad.  2) Strongly consider obtaining traveler's insurance, including emergency evacuation insurance. Most plans in the [place] do not cover participants abroad. (see below for resources)  3) Register at the appropriate [place] embassy or consulate with travel dates so they are aware of your presence in-country and for helpful advice during travel using the [employer] Traveler Enrollment Program (STEP, GuyGalaxy.sihttps://step.state.gov/step).  4) Leave contact information with a relative or friend.  5) Keep a Corporate treasurerphotocopy passport, credit cards in case they become lost or stolen  6) Inform your credit card company that you will be travelling abroad   During travel:  1) If you become ill and need medical advice, the U[place] WellPointembassy website of the country you are traveling in general provides a list of English speaking doctors.  We are also available on MyChart for remote consultation if you register prior to travel. 2) Avoid motorcycles or scooters  when at all possible. Traffic laws in many countries are lax and accidents occur frequently.  3) Do not take any unnecessary risks that you wouldn't do at home.   Resources:  -Country specific information: http://www.church.org/www.cdc.gov/travel or GuyGalaxy.sihttps://step.state.gov/step  -Chief Strategy OfficerTravel Supplies (DEET, mosquito nets): [employer], [employer] store, [employer], [employer]  -Travel insurance options: gatewayplans.com; FixItGel.esmedexassist.com; travelguard.com or Good [place] Insurance, gninsurance.com or info@gninsurance .com, [phone].   Post Travel:  If you return from your trip ill, call your primary care doctor or our travel clinic @ [phone].   Enjoy your trip and know that with proper pre-travel preparation, most people have an enjoyable and uninterrupted trip!

## 2017-10-19 NOTE — Progress Notes (Signed)
Subjective:   Virginia Hanna is a 57 y.o. female who presents to the Infectious Disease clinic for travel consultation. Planned departure date: May 2019          Planned return date: 2 weeks Countries of travel: [place] Areas in country: [place], [place]   Accommodations: safari Purpose of travel: vacation Prior travel out of [place]: yes     Objective:   Medications: reviewed    Assessment:   No contraindications to travel. none     Plan:    Issues discussed: environmental concerns, freshwater swimming, future shots, insect-borne illnesses, malaria, MVA safety, rabies, safe food/water, traveler's diarrhea, website/handouts for more information, what to do if ill upon return, what to do if ill while there and Yellow Fever. Immunizations recommended: Hepatitis A series, Td and Typhoid (parenteral). Malaria prophylaxis: malarone, daily dose starting 1-2 days before entering endemic area, ending 7 days after leaving area Traveler's diarrhea prophylaxis: azithromycin. Total duration of visit: 1 Hour. Total time spent on education, counseling, coordination of care: 30 Minutes.

## 2017-11-02 ENCOUNTER — Other Ambulatory Visit: Payer: Self-pay | Admitting: Gynecology

## 2017-11-02 DIAGNOSIS — Z7189 Other specified counseling: Secondary | ICD-10-CM | POA: Diagnosis not present

## 2017-11-02 DIAGNOSIS — Z23 Encounter for immunization: Secondary | ICD-10-CM | POA: Diagnosis not present

## 2017-11-02 DIAGNOSIS — Z1231 Encounter for screening mammogram for malignant neoplasm of breast: Secondary | ICD-10-CM

## 2017-11-02 DIAGNOSIS — Z0000 Encounter for general adult medical examination without abnormal findings: Secondary | ICD-10-CM | POA: Diagnosis not present

## 2017-11-02 NOTE — Addendum Note (Signed)
Addended by: Andree CossHOWELL, Ona Rathert M on: 11/02/2017 09:52 AM   Modules accepted: Orders

## 2017-12-26 DIAGNOSIS — M26622 Arthralgia of left temporomandibular joint: Secondary | ICD-10-CM | POA: Diagnosis not present

## 2017-12-26 DIAGNOSIS — H6123 Impacted cerumen, bilateral: Secondary | ICD-10-CM | POA: Diagnosis not present

## 2018-01-01 ENCOUNTER — Ambulatory Visit
Admission: RE | Admit: 2018-01-01 | Discharge: 2018-01-01 | Disposition: A | Discharge status: Home / Self Care | Payer: BLUE CROSS/BLUE SHIELD | Source: Ambulatory Visit | Attending: Gynecology | Admitting: Gynecology

## 2018-01-01 DIAGNOSIS — Z1231 Encounter for screening mammogram for malignant neoplasm of breast: Secondary | ICD-10-CM | POA: Diagnosis not present

## 2018-07-30 DIAGNOSIS — Z1211 Encounter for screening for malignant neoplasm of colon: Secondary | ICD-10-CM | POA: Diagnosis not present

## 2018-07-30 DIAGNOSIS — Z86010 Personal history of colonic polyps: Secondary | ICD-10-CM | POA: Diagnosis not present

## 2018-09-19 ENCOUNTER — Other Ambulatory Visit: Payer: Self-pay | Admitting: Gynecology

## 2018-09-20 ENCOUNTER — Telehealth: Payer: Self-pay | Admitting: *Deleted

## 2018-09-20 MED ORDER — ESCITALOPRAM OXALATE 10 MG PO TABS: 10.0000 mg | ORAL_TABLET | Freq: Every day | ORAL | 2 refills | Status: DC

## 2018-09-20 NOTE — Telephone Encounter (Signed)
Rx sent 

## 2018-09-20 NOTE — Telephone Encounter (Signed)
Okay for refill of Lexapro 10 mg no. 30 with 2 refills

## 2018-09-20 NOTE — Telephone Encounter (Signed)
Patient annual exam scheduled on 11/06/18, requesting refill on Lexapro 10 mg tablet.

## 2018-11-06 ENCOUNTER — Encounter: Payer: BLUE CROSS/BLUE SHIELD | Admitting: Gynecology

## 2018-12-23 DIAGNOSIS — R87610 Atypical squamous cells of undetermined significance on cytologic smear of cervix (ASC-US): Secondary | ICD-10-CM

## 2018-12-23 HISTORY — DX: Atypical squamous cells of undetermined significance on cytologic smear of cervix (ASC-US): R87.610

## 2019-01-06 ENCOUNTER — Other Ambulatory Visit: Payer: Self-pay | Admitting: *Deleted

## 2019-01-06 MED ORDER — ESCITALOPRAM OXALATE 10 MG PO TABS: 10.0000 mg | ORAL_TABLET | Freq: Every day | ORAL | 0 refills | Status: DC

## 2019-01-06 NOTE — Telephone Encounter (Signed)
Annual exam scheduled on 01/21/19, Rx sent

## 2019-01-16 ENCOUNTER — Other Ambulatory Visit: Payer: Self-pay

## 2019-01-20 ENCOUNTER — Other Ambulatory Visit: Payer: Self-pay

## 2019-01-21 ENCOUNTER — Ambulatory Visit (INDEPENDENT_AMBULATORY_CARE_PROVIDER_SITE_OTHER): Payer: BC Managed Care – PPO | Admitting: Gynecology

## 2019-01-21 ENCOUNTER — Encounter: Payer: Self-pay | Admitting: Gynecology

## 2019-01-21 VITALS — BP 118/70 | Ht 65.0 in | Wt 124.0 lb

## 2019-01-21 DIAGNOSIS — Z1151 Encounter for screening for human papillomavirus (HPV): Secondary | ICD-10-CM | POA: Diagnosis not present

## 2019-01-21 DIAGNOSIS — R87610 Atypical squamous cells of undetermined significance on cytologic smear of cervix (ASC-US): Secondary | ICD-10-CM | POA: Diagnosis not present

## 2019-01-21 DIAGNOSIS — Z01419 Encounter for gynecological examination (general) (routine) without abnormal findings: Secondary | ICD-10-CM

## 2019-01-21 DIAGNOSIS — N952 Postmenopausal atrophic vaginitis: Secondary | ICD-10-CM | POA: Diagnosis not present

## 2019-01-21 MED ORDER — ESCITALOPRAM OXALATE 10 MG PO TABS: 10.0000 mg | ORAL_TABLET | Freq: Every day | ORAL | 4 refills | Status: DC

## 2019-01-21 NOTE — Addendum Note (Signed)
Addended by: Nelva Nay on: 01/21/2019 01:13 PM   Modules accepted: Orders

## 2019-01-21 NOTE — Patient Instructions (Signed)
Follow-up in 1 year for annual exam, sooner if any issues. 

## 2019-01-21 NOTE — Progress Notes (Signed)
    Virginia Hanna 02-20-1961 [id number]        58 y.o.  [id number] for annual gynecologic exam.  Doing well without gynecologic complaints  Past medical history,surgical history, problem list, medications, allergies, family history and social history were all reviewed and documented as reviewed in the EPIC chart.  ROS:  Performed with pertinent positives and negatives included in the history, assessment and plan.   Additional significant findings : None   Exam: Caryn Bee assistant Vitals:   01/21/19 1234  BP: 118/70  Weight: 124 lb (56.2 kg)  Height: 5\' 5"  (1.651 m)   Body mass index is 20.63 kg/m.  General appearance:  Normal affect, orientation and appearance. Skin: Grossly normal HEENT: Without gross lesions.  No cervical or supraclavicular adenopathy. Thyroid normal.  Lungs:  Clear without wheezing, rales or rhonchi Cardiac: RR, without RMG Abdominal:  Soft, nontender, without masses, guarding, rebound, organomegaly or hernia Breasts:  Examined lying and sitting without masses, retractions, discharge or axillary adenopathy. Pelvic:  Ext, BUS, Vagina: Normal with mild atrophic changes  Cervix: Normal with mild atrophic changes  Uterus: Anteverted, normal size, shape and contour, midline and mobile nontender   Adnexa: Without masses or tenderness    Anus and perineum: Normal   Rectovaginal: Normal sphincter tone without palpated masses or tenderness.    Assessment/Plan:  58 y.o. G39P2002 female for annual gynecologic exam.   1. Postmenopausal/mild atrophic changes.  No significant menopausal symptoms or any vaginal bleeding. 2. Pap smear/HPV 2015.  Pap smear/HPV today.  History of LGSIL 2006 with normal Pap smears since. 3. Mammography due now and patient will schedule.  Breast exam normal today. 4. Colonoscopy 2014.  Repeat at their recommended interval. 5. DEXA never.  Will plan further into the menopause. 6. Anxiety.  Continues on Lexapro 10 mg doing well.  Refill x1  year provided. 7. Health maintenance.  No routine lab work done as she does this at her primary physician's office.  Follow-up 1 year, sooner as needed.   Anastasio Auerbach MD, 1:09 PM 01/21/2019

## 2019-01-22 LAB — PAP IG AND HPV HIGH-RISK: HPV DNA High Risk: NOT DETECTED

## 2019-01-23 ENCOUNTER — Encounter: Payer: Self-pay | Admitting: Gynecology

## 2019-02-06 DIAGNOSIS — D2262 Melanocytic nevi of left upper limb, including shoulder: Secondary | ICD-10-CM | POA: Diagnosis not present

## 2019-02-06 DIAGNOSIS — L821 Other seborrheic keratosis: Secondary | ICD-10-CM | POA: Diagnosis not present

## 2019-02-06 DIAGNOSIS — B078 Other viral warts: Secondary | ICD-10-CM | POA: Diagnosis not present

## 2019-02-06 DIAGNOSIS — D2261 Melanocytic nevi of right upper limb, including shoulder: Secondary | ICD-10-CM | POA: Diagnosis not present

## 2019-02-06 DIAGNOSIS — D225 Melanocytic nevi of trunk: Secondary | ICD-10-CM | POA: Diagnosis not present

## 2019-02-06 NOTE — Telephone Encounter (Signed)
Called patient and per DPR access note I let her know she has My Chart email from [facility] that is unread. I read her the message. Recall placed.

## 2019-02-13 ENCOUNTER — Other Ambulatory Visit: Payer: Self-pay | Admitting: Gynecology

## 2019-02-13 DIAGNOSIS — Z1231 Encounter for screening mammogram for malignant neoplasm of breast: Secondary | ICD-10-CM

## 2019-02-17 ENCOUNTER — Ambulatory Visit
Admission: RE | Admit: 2019-02-17 | Discharge: 2019-02-17 | Disposition: A | Discharge status: Home / Self Care | Payer: BLUE CROSS/BLUE SHIELD | Source: Ambulatory Visit | Attending: Gynecology | Admitting: Gynecology

## 2019-02-17 ENCOUNTER — Other Ambulatory Visit: Payer: Self-pay

## 2019-02-17 DIAGNOSIS — Z1231 Encounter for screening mammogram for malignant neoplasm of breast: Secondary | ICD-10-CM

## 2019-02-24 DIAGNOSIS — M25511 Pain in right shoulder: Secondary | ICD-10-CM | POA: Diagnosis not present

## 2019-02-24 DIAGNOSIS — M7918 Myalgia, other site: Secondary | ICD-10-CM | POA: Diagnosis not present

## 2019-02-24 DIAGNOSIS — M19011 Primary osteoarthritis, right shoulder: Secondary | ICD-10-CM | POA: Diagnosis not present

## 2019-03-17 DIAGNOSIS — Z23 Encounter for immunization: Secondary | ICD-10-CM | POA: Diagnosis not present

## 2019-03-17 DIAGNOSIS — E7800 Pure hypercholesterolemia, unspecified: Secondary | ICD-10-CM | POA: Diagnosis not present

## 2019-04-11 DIAGNOSIS — Z1211 Encounter for screening for malignant neoplasm of colon: Secondary | ICD-10-CM | POA: Diagnosis not present

## 2019-04-22 ENCOUNTER — Encounter: Payer: Self-pay | Admitting: Gynecology

## 2019-05-06 DIAGNOSIS — E7800 Pure hypercholesterolemia, unspecified: Secondary | ICD-10-CM | POA: Diagnosis not present

## 2019-07-10 ENCOUNTER — Ambulatory Visit: Payer: BC Managed Care – PPO | Attending: Internal Medicine

## 2019-07-10 DIAGNOSIS — Z20828 Contact with and (suspected) exposure to other viral communicable diseases: Secondary | ICD-10-CM | POA: Diagnosis not present

## 2019-07-10 DIAGNOSIS — Z20822 Contact with and (suspected) exposure to covid-19: Secondary | ICD-10-CM

## 2019-07-11 LAB — NOVEL CORONAVIRUS, NAA: SARS-CoV-2, NAA: NOT DETECTED

## 2019-07-29 ENCOUNTER — Ambulatory Visit: Payer: BC Managed Care – PPO | Attending: Internal Medicine

## 2019-07-29 DIAGNOSIS — Z20822 Contact with and (suspected) exposure to covid-19: Secondary | ICD-10-CM | POA: Diagnosis not present

## 2019-07-31 LAB — NOVEL CORONAVIRUS, NAA: SARS-CoV-2, NAA: DETECTED — AB

## 2019-08-22 DIAGNOSIS — Z03818 Encounter for observation for suspected exposure to other biological agents ruled out: Secondary | ICD-10-CM | POA: Diagnosis not present

## 2020-02-16 DIAGNOSIS — E7800 Pure hypercholesterolemia, unspecified: Secondary | ICD-10-CM | POA: Diagnosis not present

## 2020-02-16 DIAGNOSIS — F419 Anxiety disorder, unspecified: Secondary | ICD-10-CM | POA: Diagnosis not present

## 2020-04-05 ENCOUNTER — Other Ambulatory Visit: Payer: Self-pay | Admitting: Nurse Practitioner

## 2020-04-05 DIAGNOSIS — Z1231 Encounter for screening mammogram for malignant neoplasm of breast: Secondary | ICD-10-CM

## 2020-04-14 DIAGNOSIS — L814 Other melanin hyperpigmentation: Secondary | ICD-10-CM | POA: Diagnosis not present

## 2020-04-14 DIAGNOSIS — D2262 Melanocytic nevi of left upper limb, including shoulder: Secondary | ICD-10-CM | POA: Diagnosis not present

## 2020-04-14 DIAGNOSIS — D225 Melanocytic nevi of trunk: Secondary | ICD-10-CM | POA: Diagnosis not present

## 2020-04-14 DIAGNOSIS — D2272 Melanocytic nevi of left lower limb, including hip: Secondary | ICD-10-CM | POA: Diagnosis not present

## 2020-04-23 ENCOUNTER — Ambulatory Visit: Payer: BC Managed Care – PPO

## 2020-05-06 DIAGNOSIS — S01311A Laceration without foreign body of right ear, initial encounter: Secondary | ICD-10-CM | POA: Diagnosis not present

## 2020-05-19 ENCOUNTER — Ambulatory Visit
Admission: RE | Admit: 2020-05-19 | Discharge: 2020-05-19 | Disposition: A | Discharge status: Home / Self Care | Payer: BC Managed Care – PPO | Source: Ambulatory Visit | Attending: Nurse Practitioner | Admitting: Nurse Practitioner

## 2020-05-19 ENCOUNTER — Other Ambulatory Visit: Payer: Self-pay

## 2020-05-19 DIAGNOSIS — Z1231 Encounter for screening mammogram for malignant neoplasm of breast: Secondary | ICD-10-CM

## 2020-07-10 ENCOUNTER — Ambulatory Visit: Payer: BC Managed Care – PPO | Attending: Internal Medicine

## 2020-07-10 DIAGNOSIS — Z23 Encounter for immunization: Secondary | ICD-10-CM

## 2020-07-10 NOTE — Progress Notes (Signed)
   Covid-19 Vaccination Clinic  Name:  MAHIMA HOTTLE    MRN: [id number] DOB: Apr 04, 1961  07/10/2020  Ms. Regan was observed post Covid-19 immunization for 15 minutes without incident. She was provided with Vaccine Information Sheet and instruction to access the V-Safe system.   Ms. Saks was instructed to call 911 with any severe reactions post vaccine: Marland Kitchen Difficulty breathing  . Swelling of face and throat  . A fast heartbeat  . A bad rash all over body  . Dizziness and weakness   Immunizations Administered    Name Date Dose VIS Date Route   Pfizer COVID-19 Vaccine 07/10/2020 10:47 AM 0.3 mL 05/12/2020 Intramuscular   Manufacturer: [employer], [employer]   Lot: [id number]   NDC: [address]-7

## 2020-07-27 DIAGNOSIS — H0100B Unspecified blepharitis left eye, upper and lower eyelids: Secondary | ICD-10-CM | POA: Diagnosis not present

## 2020-07-27 DIAGNOSIS — H04123 Dry eye syndrome of bilateral lacrimal glands: Secondary | ICD-10-CM | POA: Diagnosis not present

## 2020-07-27 DIAGNOSIS — H0100A Unspecified blepharitis right eye, upper and lower eyelids: Secondary | ICD-10-CM | POA: Diagnosis not present

## 2020-08-26 ENCOUNTER — Ambulatory Visit: Payer: BC Managed Care – PPO | Admitting: Sports Medicine

## 2020-09-01 ENCOUNTER — Telehealth: Payer: Self-pay | Admitting: Sports Medicine

## 2020-09-01 NOTE — Telephone Encounter (Signed)
LVM for patient to return call to update insurance information. Patient has [employer] and its possible she has received new card for the year and needs to be updated

## 2020-09-02 ENCOUNTER — Encounter: Payer: Self-pay | Admitting: Sports Medicine

## 2020-09-02 ENCOUNTER — Other Ambulatory Visit: Payer: Self-pay

## 2020-09-02 ENCOUNTER — Ambulatory Visit (INDEPENDENT_AMBULATORY_CARE_PROVIDER_SITE_OTHER): Payer: BC Managed Care – PPO | Admitting: Sports Medicine

## 2020-09-02 DIAGNOSIS — M2042 Other hammer toe(s) (acquired), left foot: Secondary | ICD-10-CM

## 2020-09-02 DIAGNOSIS — L84 Corns and callosities: Secondary | ICD-10-CM

## 2020-09-02 DIAGNOSIS — M79675 Pain in left toe(s): Secondary | ICD-10-CM

## 2020-09-02 NOTE — Progress Notes (Signed)
  Subjective: Virginia Hanna is a 60 y.o. female patient who presents to office for evaluation of Left foot pain secondary to corn at 4th toe on left. Patient complains of pain at the lesion present at lateral 4th toe. Patient hasnt tried anything. Patient denies any other pedal complaints.   Review of Systems  All other systems reviewed and are negative.   Patient Active Problem List   Diagnosis Date Noted  . Travel advice encounter 10/19/2017  . Flow murmur 10/04/2010    Current Outpatient Medications on File Prior to Visit  Medication Sig Dispense Refill  . Cholecalciferol (VITAMIN D PO) Take by mouth.    . Cyanocobalamin (VITAMIN B 12 PO) Take by mouth.    . escitalopram (LEXAPRO) 10 MG tablet Take 1 tablet (10 mg total) by mouth daily. 90 tablet 4  . fexofenadine (ALLEGRA) 180 MG tablet Take 180 mg by mouth daily.       No current facility-administered medications on file prior to visit.    Allergies  Allergen Reactions  . Latex Rash    Objective:  General: Alert and oriented x3 in no acute distress  Dermatology: Keratotic lesion present 4th toe lateral aspect on left with skin lines transversing the lesion, pain is present with direct pressure to the lesion with a central nucleated core noted, no webspace macerations, no ecchymosis bilateral, all nails x 10 are well manicured.  Vascular: Dorsalis Pedis and Posterior Tibial pedal pulses 2/4, Capillary Fill Time 3 seconds, + pedal hair growth bilateral, no edema bilateral lower extremities, Temperature gradient within normal limits.  Neurology: Michaell Cowing sensation intact via light touch bilateral.  Musculoskeletal: Mild tenderness with palpation at the keratotic lesion site on left foot at 4th webspace, + varus 5th toe on left.  Assessment and Plan: Problem List Items Addressed This Visit   None   Visit Diagnoses    Corn of toe    -  Primary   Toe pain, left       Hammertoe of left foot          -Complete examination  performed -Discussed treatment options -Parred keratoic lesion x1 on left 4th toe using a 15 blade; treated the area with Salinocaine covered with bandaid -Dispensed toe spacer -Encouraged daily skin emollients -Encouraged use of pumice stone -Advised good supportive shoes  -Patient to return to office as needed or sooner if condition worsens.  Asencion Islam, DPM

## 2021-01-14 DIAGNOSIS — U071 COVID-19: Secondary | ICD-10-CM | POA: Diagnosis not present

## 2021-01-20 IMAGING — MG DIGITAL SCREENING BILAT W/ TOMO W/ CAD
8 series · 9 of 24 positions shown · non-contrast
Comparison: Previous exam(s).

CLINICAL DATA: Screening.

EXAM:
DIGITAL SCREENING BILATERAL MAMMOGRAM WITH TOMO AND CAD

[R CC synth-2D]
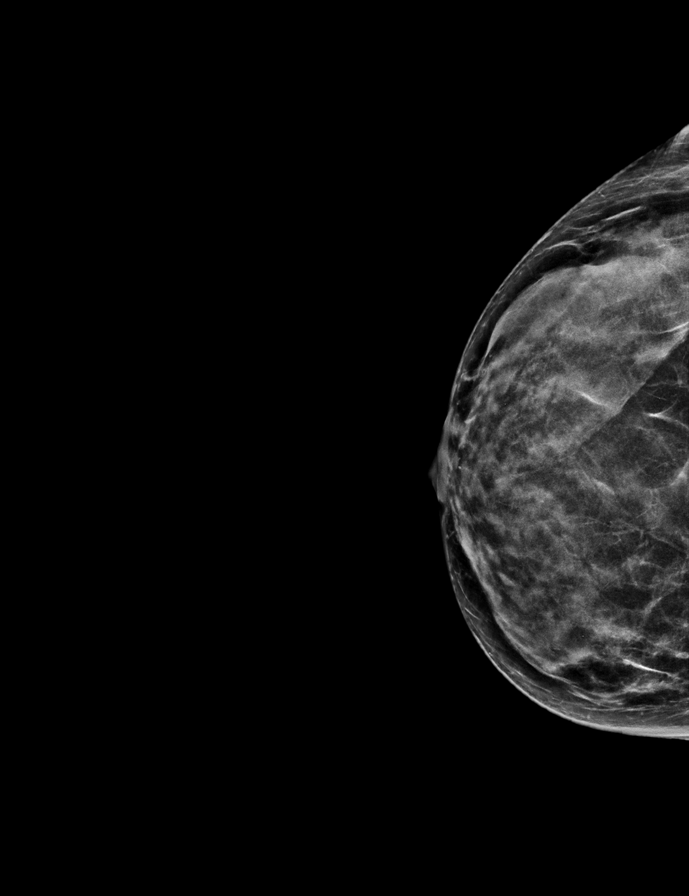

[L MLO synth-2D]
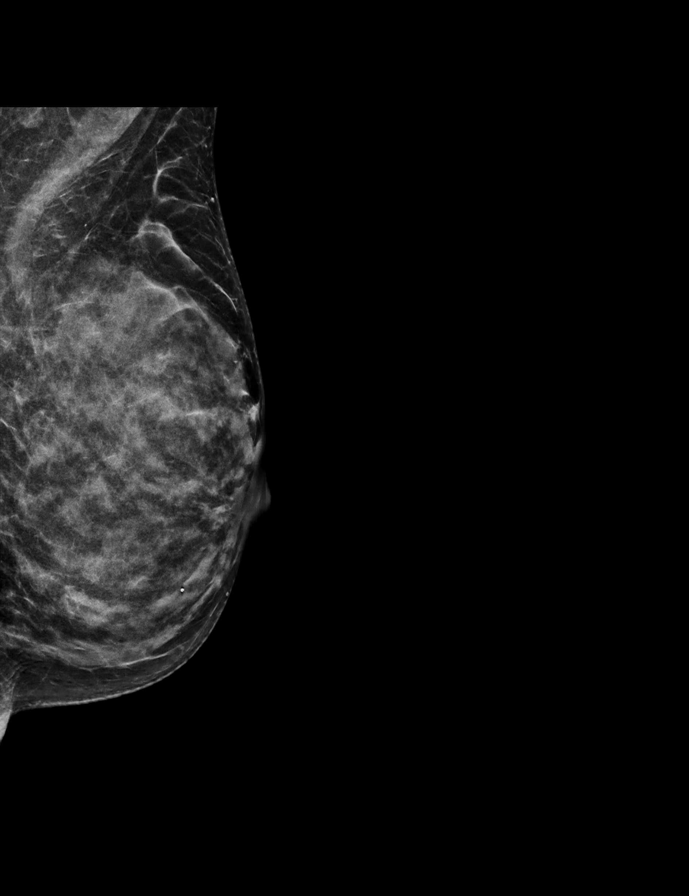

[R MLO synth-2D]
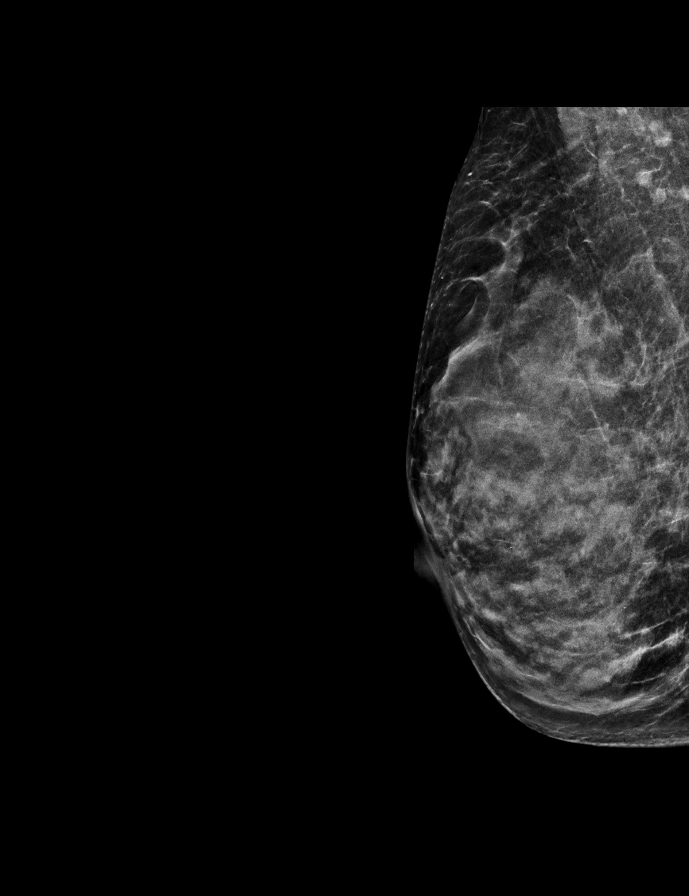

[L CC synth-2D]
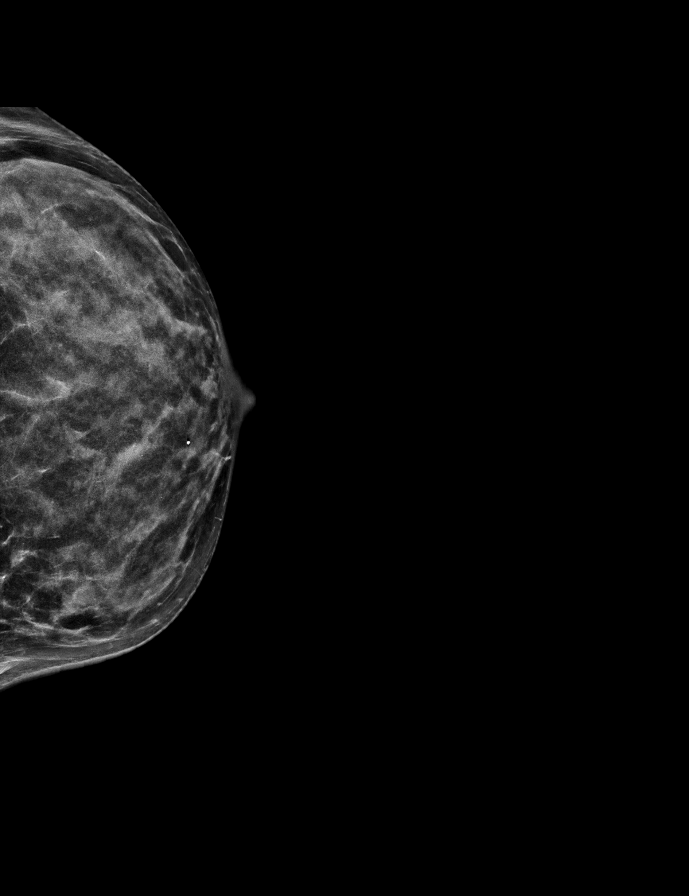

[R MLO tomo · 2 of 48 frames shown]
[frame 16/48]
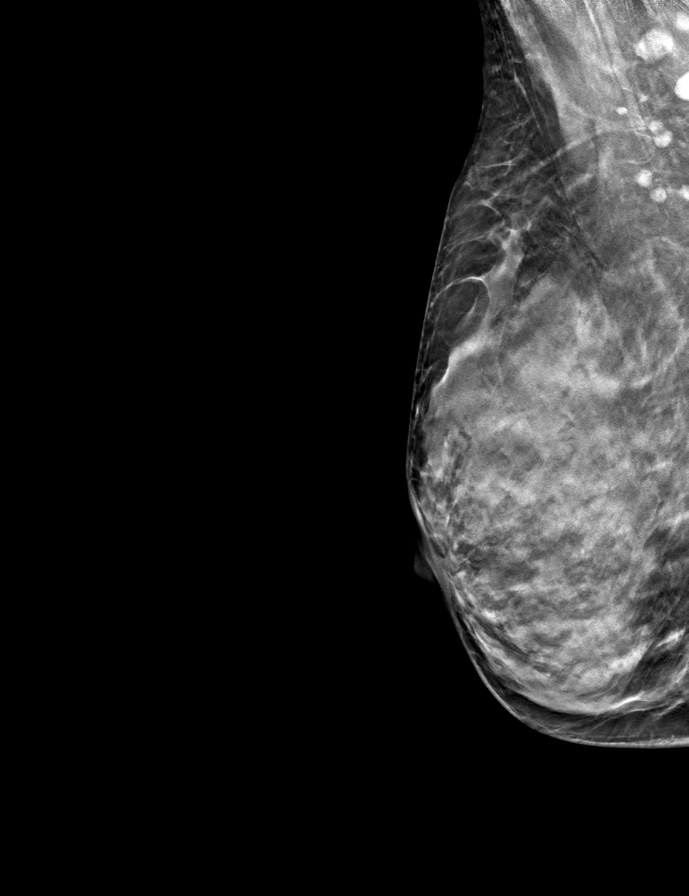
[frame 25/48]
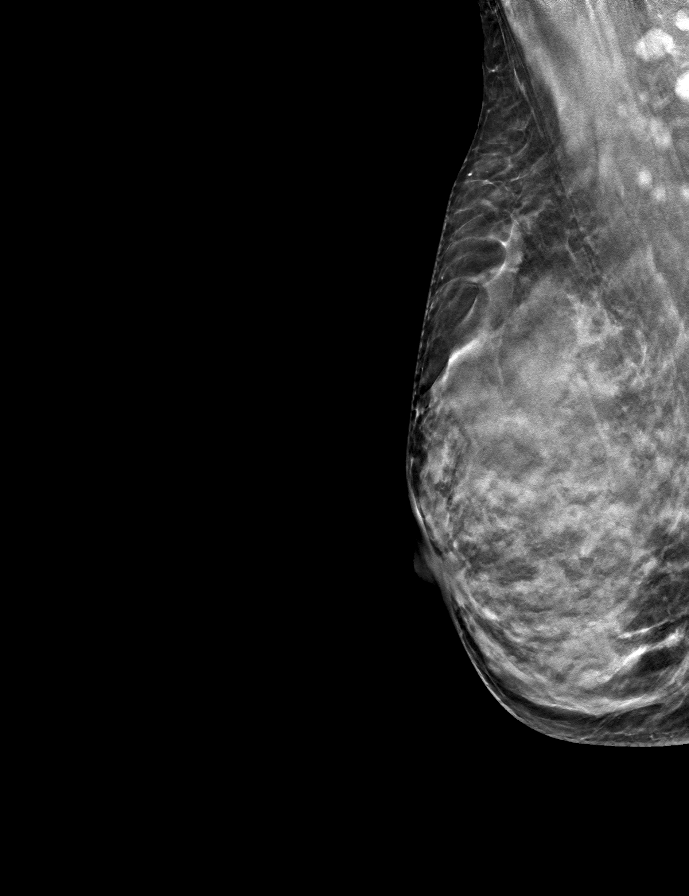

[R CC tomo · tomo slice 25/49.0]
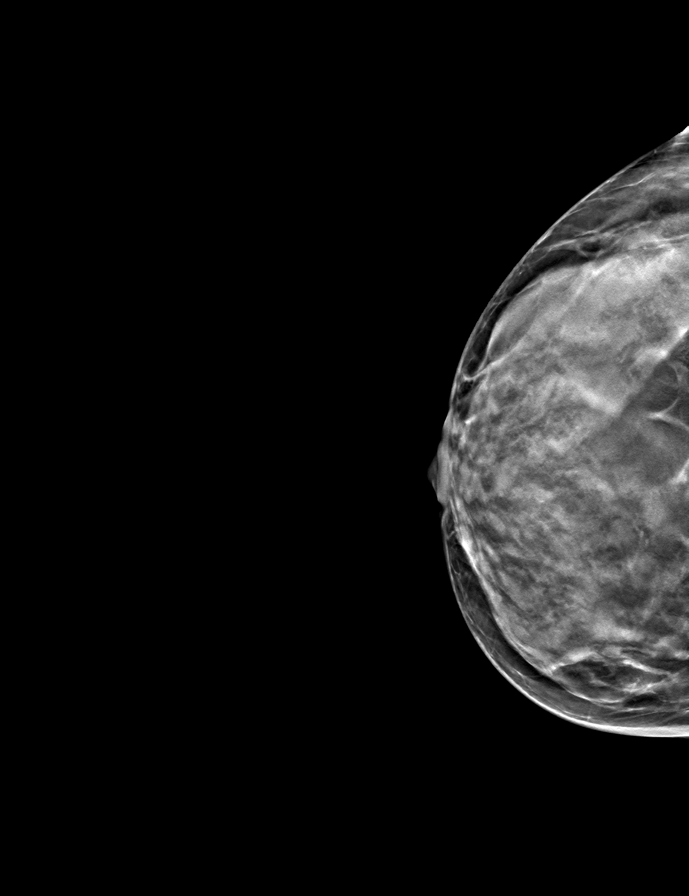

[L MLO tomo · tomo slice 25/49.0]
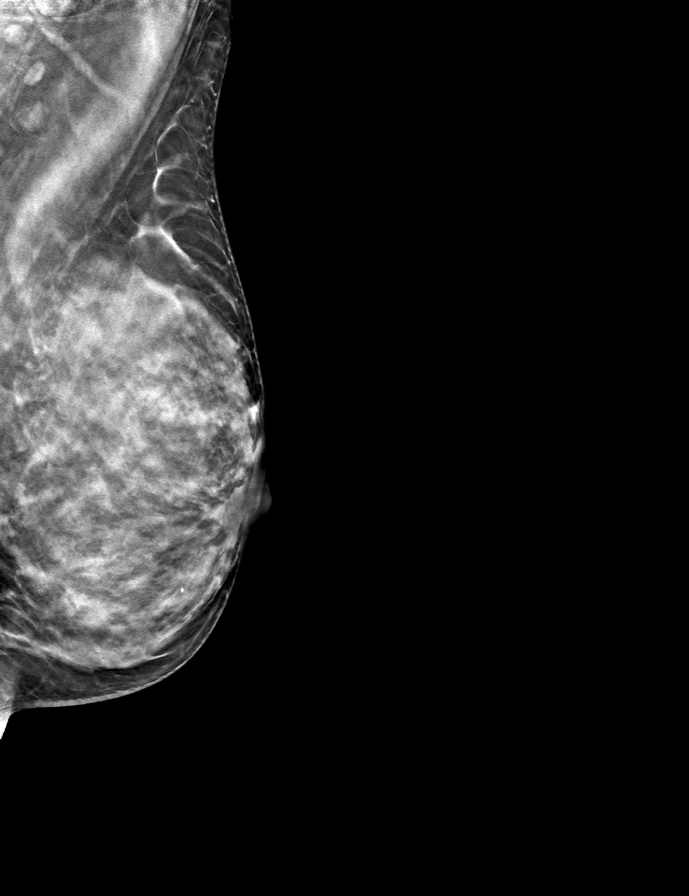

[L CC tomo · tomo slice 25/49.0]
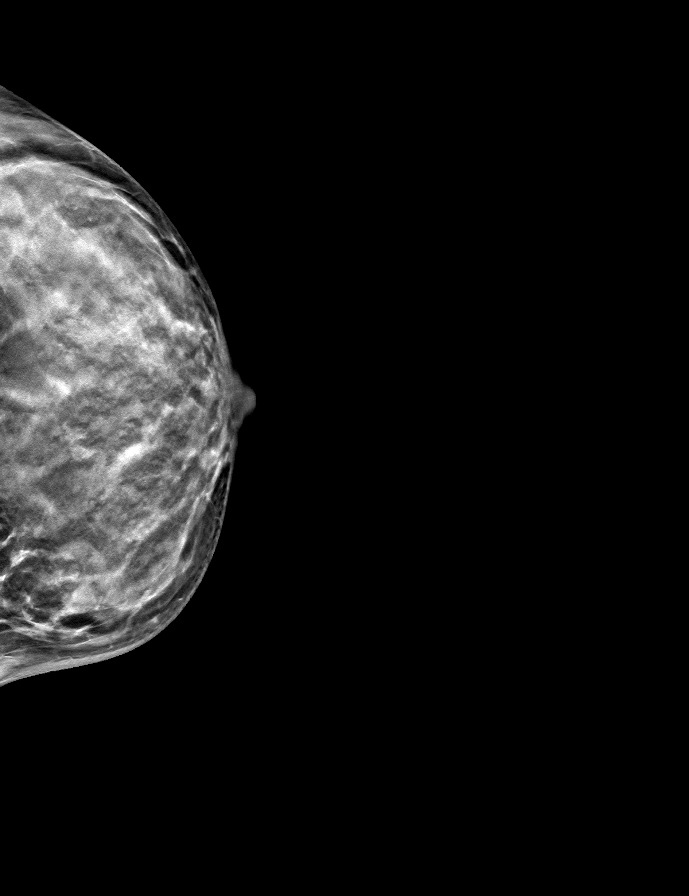

[9 of 24 positions shown; findings below may reference images not displayed]

ACR Breast Density Category d: The breast tissue is extremely dense,
which lowers the sensitivity of mammography
FINDINGS: There are no findings suspicious for malignancy. Images were
processed with CAD.
IMPRESSION: No mammographic evidence of malignancy. A result letter of this
screening mammogram will be mailed directly to the patient.

RECOMMENDATION:
Screening mammogram in one year. (Code:[id number]-[id number])

BI-RADS CATEGORY  1: Negative.

## 2021-04-04 DIAGNOSIS — E7800 Pure hypercholesterolemia, unspecified: Secondary | ICD-10-CM | POA: Diagnosis not present

## 2021-04-04 DIAGNOSIS — F419 Anxiety disorder, unspecified: Secondary | ICD-10-CM | POA: Diagnosis not present

## 2021-04-22 ENCOUNTER — Other Ambulatory Visit: Payer: Self-pay | Admitting: Nurse Practitioner

## 2021-04-22 DIAGNOSIS — Z1231 Encounter for screening mammogram for malignant neoplasm of breast: Secondary | ICD-10-CM

## 2021-05-20 ENCOUNTER — Ambulatory Visit
Admission: RE | Admit: 2021-05-20 | Discharge: 2021-05-20 | Disposition: A | Discharge status: Home / Self Care | Payer: BC Managed Care – PPO | Source: Ambulatory Visit | Attending: Nurse Practitioner | Admitting: Nurse Practitioner

## 2021-05-20 ENCOUNTER — Other Ambulatory Visit: Payer: Self-pay

## 2021-05-20 DIAGNOSIS — Z1231 Encounter for screening mammogram for malignant neoplasm of breast: Secondary | ICD-10-CM

## 2021-05-25 DIAGNOSIS — J0110 Acute frontal sinusitis, unspecified: Secondary | ICD-10-CM | POA: Diagnosis not present

## 2021-07-05 DIAGNOSIS — Z1331 Encounter for screening for depression: Secondary | ICD-10-CM | POA: Diagnosis not present

## 2021-07-05 DIAGNOSIS — F411 Generalized anxiety disorder: Secondary | ICD-10-CM | POA: Diagnosis not present

## 2021-07-06 ENCOUNTER — Other Ambulatory Visit: Payer: Self-pay | Admitting: Internal Medicine

## 2021-07-06 DIAGNOSIS — E785 Hyperlipidemia, unspecified: Secondary | ICD-10-CM

## 2021-07-21 ENCOUNTER — Other Ambulatory Visit: Payer: Self-pay | Admitting: Internal Medicine

## 2021-07-21 DIAGNOSIS — J069 Acute upper respiratory infection, unspecified: Secondary | ICD-10-CM | POA: Diagnosis not present

## 2021-07-21 DIAGNOSIS — R519 Headache, unspecified: Secondary | ICD-10-CM | POA: Diagnosis not present

## 2021-07-21 DIAGNOSIS — Z13820 Encounter for screening for osteoporosis: Secondary | ICD-10-CM

## 2021-08-01 ENCOUNTER — Other Ambulatory Visit: Payer: Self-pay

## 2021-08-01 ENCOUNTER — Ambulatory Visit (INDEPENDENT_AMBULATORY_CARE_PROVIDER_SITE_OTHER): Payer: BC Managed Care – PPO | Admitting: Obstetrics & Gynecology

## 2021-08-01 ENCOUNTER — Ambulatory Visit
Admission: RE | Admit: 2021-08-01 | Discharge: 2021-08-01 | Disposition: A | Discharge status: Home / Self Care | Payer: No Typology Code available for payment source | Source: Ambulatory Visit | Attending: Internal Medicine | Admitting: Internal Medicine

## 2021-08-01 ENCOUNTER — Other Ambulatory Visit (HOSPITAL_COMMUNITY)
Admission: RE | Admit: 2021-08-01 | Discharge: 2021-08-01 | Disposition: A | Discharge status: Home / Self Care | Payer: BC Managed Care – PPO | Source: Ambulatory Visit | Attending: Obstetrics & Gynecology | Admitting: Obstetrics & Gynecology

## 2021-08-01 ENCOUNTER — Encounter: Payer: Self-pay | Admitting: Obstetrics & Gynecology

## 2021-08-01 VITALS — BP 108/70 | HR 86 | Ht 64.0 in | Wt 122.0 lb

## 2021-08-01 DIAGNOSIS — Z780 Asymptomatic menopausal state: Secondary | ICD-10-CM | POA: Diagnosis not present

## 2021-08-01 DIAGNOSIS — Z01419 Encounter for gynecological examination (general) (routine) without abnormal findings: Secondary | ICD-10-CM | POA: Insufficient documentation

## 2021-08-01 DIAGNOSIS — E785 Hyperlipidemia, unspecified: Secondary | ICD-10-CM

## 2021-08-01 NOTE — Progress Notes (Signed)
Virginia Hanna June 03, 1961 [id number]   History:    61 y.o. G2P2L2  Daughter is 13 and son is 37 yo.  Son got married in 2022.  RP:  Established patient presenting for annual gyn exam   HPI: Postmenopausal/mild atrophic changes.  No significant menopausal symptoms or any vaginal bleeding. Pap smear 12/2018 Negative.  History of LGSIL 2006 with normal Pap smears since.  Breasts normal.  Screening mammo 04/2021 Negative. Colonoscopy 2020.  Repeat at their recommended interval.  BMI 20.94.  Health Labs with Fam MD.  Continues on Lexapro 10 mg daily for anxiety.  Past medical history,surgical history, family history and social history were all reviewed and documented in the EPIC chart.  Gynecologic History Patient's last menstrual period was 03/08/2015.  Obstetric History OB History  Gravida Para Term Preterm AB Living  2 2 2     2   SAB IAB Ectopic Multiple Live Births               # Outcome Date GA Lbr Len/2nd Weight Sex Delivery Anes PTL Lv  2 Term           1 Term              ROS: A ROS was performed and pertinent positives and negatives are included in the history.  GENERAL: No fevers or chills. HEENT: No change in vision, no earache, sore throat or sinus congestion. NECK: No pain or stiffness. CARDIOVASCULAR: No chest pain or pressure. No palpitations. PULMONARY: No shortness of breath, cough or wheeze. GASTROINTESTINAL: No abdominal pain, nausea, vomiting or diarrhea, melena or bright red blood per rectum. GENITOURINARY: No urinary frequency, urgency, hesitancy or dysuria. MUSCULOSKELETAL: No joint or muscle pain, no back pain, no recent trauma. DERMATOLOGIC: No rash, no itching, no lesions. ENDOCRINE: No polyuria, polydipsia, no heat or cold intolerance. No recent change in weight. HEMATOLOGICAL: No anemia or easy bruising or bleeding. NEUROLOGIC: No headache, seizures, numbness, tingling or weakness. PSYCHIATRIC: No depression, no loss of interest in normal activity or change in  sleep pattern.     Exam:   BP 108/70    Pulse 86    Ht 5\' 4"  (1.626 m)    Wt 122 lb (55.3 kg)    LMP 03/08/2015    SpO2 99%    BMI 20.94 kg/m   Body mass index is 20.94 kg/m.  General appearance : Well developed well nourished female. No acute distress HEENT: Eyes: no retinal hemorrhage or exudates,  Neck supple, trachea midline, no carotid bruits, no thyroidmegaly Lungs: Clear to auscultation, no rhonchi or wheezes, or rib retractions  Heart: Regular rate and rhythm, no murmurs or gallops Breast:Examined in sitting and supine position were symmetrical in appearance, no palpable masses or tenderness,  no skin retraction, no nipple inversion, no nipple discharge, no skin discoloration, no axillary or supraclavicular lymphadenopathy Abdomen: no palpable masses or tenderness, no rebound or guarding Extremities: no edema or skin discoloration or tenderness  Pelvic: Vulva: Normal             Vagina: No gross lesions or discharge  Cervix: No gross lesions or discharge.  Pap reflex done.  Uterus  AV, normal size, shape and consistency, non-tender and mobile  Adnexa  Without masses or tenderness  Anus: Normal   Assessment/Plan:  61 y.o. female for annual exam   1. Encounter for routine gynecological examination with Papanicolaou smear of cervix Postmenopausal/mild atrophic changes.  No significant menopausal symptoms or any vaginal  bleeding. Pap smear 12/2018 Negative.  History of LGSIL 2006 with normal Pap smears since.  Pap reflex done today.  Breasts normal.  Screening mammo 04/2021 Negative. Colonoscopy 2020.  Repeat at their recommended interval.  BMI 20.94.  Health Labs with Fam MD.  Continues on Lexapro 10 mg daily for anxiety. - Cytology - PAP( [facility])  2. Postmenopause Postmenopausal/mild atrophic changes.  No significant menopausal symptoms or any vaginal bleeding.  Bone Density at 61-65 yo.  Other orders - augmented betamethasone dipropionate (DIPROLENE-AF) 0.05 % cream;  betamethasone, augmented 0.05 % topical cream - cholecalciferol (VITAMIN D3) 25 MCG (1000 UNIT) tablet; 1 tablet - Multiple Vitamin (MULTI VITAMIN) TABS; 1 tablet   Genia Del MD, 3:44 PM 08/01/2021

## 2021-08-02 ENCOUNTER — Encounter: Payer: Self-pay | Admitting: Obstetrics & Gynecology

## 2021-08-04 DIAGNOSIS — L814 Other melanin hyperpigmentation: Secondary | ICD-10-CM | POA: Diagnosis not present

## 2021-08-04 DIAGNOSIS — D225 Melanocytic nevi of trunk: Secondary | ICD-10-CM | POA: Diagnosis not present

## 2021-08-04 DIAGNOSIS — B078 Other viral warts: Secondary | ICD-10-CM | POA: Diagnosis not present

## 2021-08-04 DIAGNOSIS — D2262 Melanocytic nevi of left upper limb, including shoulder: Secondary | ICD-10-CM | POA: Diagnosis not present

## 2021-08-04 LAB — CYTOLOGY - PAP
Comment: NEGATIVE
High risk HPV: NEGATIVE

## 2021-08-10 ENCOUNTER — Other Ambulatory Visit: Payer: Self-pay

## 2021-08-10 DIAGNOSIS — R87610 Atypical squamous cells of undetermined significance on cytologic smear of cervix (ASC-US): Secondary | ICD-10-CM

## 2021-09-08 DIAGNOSIS — B0089 Other herpesviral infection: Secondary | ICD-10-CM | POA: Diagnosis not present

## 2021-09-22 ENCOUNTER — Ambulatory Visit: Payer: BC Managed Care – PPO | Admitting: Obstetrics & Gynecology

## 2021-10-27 ENCOUNTER — Ambulatory Visit: Payer: BC Managed Care – PPO | Admitting: Obstetrics & Gynecology

## 2021-12-12 ENCOUNTER — Ambulatory Visit (INDEPENDENT_AMBULATORY_CARE_PROVIDER_SITE_OTHER): Payer: BC Managed Care – PPO | Admitting: Obstetrics & Gynecology

## 2021-12-12 ENCOUNTER — Other Ambulatory Visit (HOSPITAL_COMMUNITY)
Admission: RE | Admit: 2021-12-12 | Discharge: 2021-12-12 | Disposition: A | Discharge status: Home / Self Care | Payer: BC Managed Care – PPO | Source: Ambulatory Visit | Attending: Obstetrics & Gynecology | Admitting: Obstetrics & Gynecology

## 2021-12-12 ENCOUNTER — Encounter: Payer: Self-pay | Admitting: Obstetrics & Gynecology

## 2021-12-12 DIAGNOSIS — N879 Dysplasia of cervix uteri, unspecified: Secondary | ICD-10-CM | POA: Diagnosis not present

## 2021-12-12 DIAGNOSIS — R87610 Atypical squamous cells of undetermined significance on cytologic smear of cervix (ASC-US): Secondary | ICD-10-CM | POA: Diagnosis not present

## 2021-12-12 DIAGNOSIS — N888 Other specified noninflammatory disorders of cervix uteri: Secondary | ICD-10-CM | POA: Diagnosis not present

## 2021-12-12 NOTE — Progress Notes (Signed)
    Virginia Hanna 1961/07/13 [id number]        61 y.o.  G2P2L2   RP: Atypia on Pap x 2/ HPV HR Neg for Colposcopy  HPI: Atypia on Pap x 2/ HPV HR Neg in 12/2018 and 07/2021.   OB History  Gravida Para Term Preterm AB Living  2 2 2     2   SAB IAB Ectopic Multiple Live Births               # Outcome Date GA Lbr Len/2nd Weight Sex Delivery Anes PTL Lv  2 Term           1 Term             Past medical history,surgical history, problem list, medications, allergies, family history and social history were all reviewed and documented in the EPIC chart.   Directed ROS with pertinent positives and negatives documented in the history of present illness/assessment and plan.  Exam:  Vitals:   12/12/21 1059  BP: 112/66   General appearance:  Normal  Colposcopy Procedure Note Virginia Hanna 12/12/2021  Indications: Atypia on Pap x 2/ HPV HR Neg in 12/2018 and 07/2021.  Procedure Details  The risks and benefits of the procedure and Written informed consent obtained.  Speculum placed in vagina and excellent visualization of cervix achieved, cervix swabbed x 3 with acetic acid solution.  Findings:  Cervix colposcopy: Physical Exam Genitourinary:       Vaginal colposcopy: Normal  Vulvar colposcopy: Normal  Perirectal colposcopy: Normal  The cervix was sprayed with Hurricane before performing the cervical biopsies.  Specimens: Cervical Bx at 1 O'Clock.  Complications:  No Cx, good hemostasis with Silver Nitrate. . Plan: Management per results   Assessment/Plan:  61 y.o. G2P2002   1. ASCUS of cervix with negative high risk HPV Atypia on Pap x 2/ HPV HR Neg in 12/2018 and 07/2021.  Counseling on abnormal Pap test.  Colposcopy findings reviewed.  Post colpo precautions.  Management per results. - Colposcopy - Surgical pathology( [place]/ [facility])   Virginia Bruins MD, 11:28 AM 12/12/2021

## 2021-12-13 ENCOUNTER — Encounter: Payer: Self-pay | Admitting: Obstetrics & Gynecology

## 2021-12-16 LAB — SURGICAL PATHOLOGY

## 2022-01-02 ENCOUNTER — Encounter: Payer: Self-pay | Admitting: Nurse Practitioner

## 2022-01-02 ENCOUNTER — Ambulatory Visit (INDEPENDENT_AMBULATORY_CARE_PROVIDER_SITE_OTHER): Payer: BC Managed Care – PPO | Admitting: Nurse Practitioner

## 2022-01-02 VITALS — BP 116/76

## 2022-01-02 DIAGNOSIS — H10413 Chronic giant papillary conjunctivitis, bilateral: Secondary | ICD-10-CM | POA: Diagnosis not present

## 2022-01-02 DIAGNOSIS — B3731 Acute candidiasis of vulva and vagina: Secondary | ICD-10-CM

## 2022-01-02 DIAGNOSIS — R339 Retention of urine, unspecified: Secondary | ICD-10-CM

## 2022-01-02 DIAGNOSIS — R350 Frequency of micturition: Secondary | ICD-10-CM

## 2022-01-02 DIAGNOSIS — N898 Other specified noninflammatory disorders of vagina: Secondary | ICD-10-CM

## 2022-01-02 DIAGNOSIS — H04123 Dry eye syndrome of bilateral lacrimal glands: Secondary | ICD-10-CM | POA: Diagnosis not present

## 2022-01-02 LAB — WET PREP FOR TRICH, YEAST, CLUE

## 2022-01-02 MED ORDER — FLUCONAZOLE 150 MG PO TABS: 150.0000 mg | ORAL_TABLET | ORAL | 0 refills | Status: DC

## 2022-01-02 NOTE — Progress Notes (Signed)
   Acute Office Visit  Subjective:    Patient ID: Virginia Hanna, female    DOB: 1960/12/06, 61 y.o.   MRN: [id number]   HPI 61 y.o. presents today for vaginal itching that started a couple of weeks ago. Denies discharge or odor. Also complains of feeling like she does not fully empty bladder when urinating. Not new for her but worsening. Mild stress incontinence.    Review of Systems  Constitutional: Negative.   Genitourinary:  Positive for frequency and vaginal pain (Itching). Negative for difficulty urinating, dysuria, hematuria, urgency and vaginal discharge.       Objective:    Physical Exam Constitutional:      Appearance: Normal appearance.  Genitourinary:    General: Normal vulva.     Vagina: Normal.     Cervix: Normal.     BP 116/76   LMP 03/08/2015  Wt Readings from Last 3 Encounters:  08/01/21 122 lb (55.3 kg)  01/21/19 124 lb (56.2 kg)  08/09/17 118 lb (53.5 kg)        Patient informed chaperone available to be present for breast and/or pelvic exam. Patient has requested no chaperone to be present. Patient has been advised what will be completed during breast and pelvic exam.   Wet prep + yeast UA negative  Assessment & Plan:   Problem List Items Addressed This Visit   None Visit Diagnoses     Vaginal candidiasis    -  Primary   Relevant Medications   fluconazole (DIFLUCAN) 150 MG tablet   Vagina itching       Relevant Orders   WET PREP FOR TRICH, YEAST, CLUE   Frequent urination       Relevant Orders   Urinalysis,Complete w/RFL Culture (Completed)   Incomplete emptying of bladder       Relevant Medications   fluconazole (DIFLUCAN) 150 MG tablet      Plan: Wet prep positive for yeast - Diflucan 150 mg today and repeat in 3 days for total of 2 days. UA unremarkable. Discussed double voiding to help with emptying bladder, pelvic floor strengthening exercises, and option for pelvic floor PT.      Olivia Mackie DNP, 4:15 PM 01/02/2022

## 2022-01-04 LAB — URINALYSIS, COMPLETE W/RFL CULTURE
Bacteria, UA: NONE SEEN /HPF
Bilirubin Urine: NEGATIVE
Glucose, UA: NEGATIVE
Hyaline Cast: NONE SEEN /LPF
Leukocyte Esterase: NEGATIVE
Nitrites, Initial: NEGATIVE
Protein, ur: NEGATIVE
Specific Gravity, Urine: 1.02 (ref 1.001–1.035)
pH: 5 (ref 5.0–8.0)

## 2022-01-04 LAB — URINE CULTURE
MICRO NUMBER:: 13512908
Result:: NO GROWTH
SPECIMEN QUALITY:: ADEQUATE

## 2022-01-04 LAB — CULTURE INDICATED

## 2022-01-09 ENCOUNTER — Ambulatory Visit
Admission: RE | Admit: 2022-01-09 | Discharge: 2022-01-09 | Disposition: A | Discharge status: Home / Self Care | Payer: No Typology Code available for payment source | Source: Ambulatory Visit | Attending: Internal Medicine | Admitting: Internal Medicine

## 2022-01-09 DIAGNOSIS — Z13820 Encounter for screening for osteoporosis: Secondary | ICD-10-CM

## 2022-01-09 DIAGNOSIS — Z780 Asymptomatic menopausal state: Secondary | ICD-10-CM | POA: Diagnosis not present

## 2022-04-19 ENCOUNTER — Other Ambulatory Visit: Payer: Self-pay | Admitting: Nurse Practitioner

## 2022-04-19 ENCOUNTER — Other Ambulatory Visit: Payer: Self-pay | Admitting: Obstetrics & Gynecology

## 2022-04-19 DIAGNOSIS — Z1231 Encounter for screening mammogram for malignant neoplasm of breast: Secondary | ICD-10-CM

## 2022-05-22 ENCOUNTER — Ambulatory Visit
Admission: RE | Admit: 2022-05-22 | Discharge: 2022-05-22 | Disposition: A | Discharge status: Home / Self Care | Payer: BC Managed Care – PPO | Source: Ambulatory Visit | Attending: Obstetrics & Gynecology | Admitting: Obstetrics & Gynecology

## 2022-05-22 DIAGNOSIS — Z1231 Encounter for screening mammogram for malignant neoplasm of breast: Secondary | ICD-10-CM | POA: Diagnosis not present

## 2022-06-20 ENCOUNTER — Encounter: Payer: Self-pay | Admitting: Obstetrics & Gynecology

## 2022-06-20 ENCOUNTER — Other Ambulatory Visit (HOSPITAL_COMMUNITY)
Admission: RE | Admit: 2022-06-20 | Discharge: 2022-06-20 | Disposition: A | Discharge status: Home / Self Care | Payer: BC Managed Care – PPO | Source: Ambulatory Visit | Attending: Obstetrics & Gynecology | Admitting: Obstetrics & Gynecology

## 2022-06-20 ENCOUNTER — Ambulatory Visit (INDEPENDENT_AMBULATORY_CARE_PROVIDER_SITE_OTHER): Payer: BC Managed Care – PPO | Admitting: Obstetrics & Gynecology

## 2022-06-20 VITALS — BP 108/60 | HR 62 | Resp 14 | Ht 65.0 in | Wt 122.0 lb

## 2022-06-20 DIAGNOSIS — R87610 Atypical squamous cells of undetermined significance on cytologic smear of cervix (ASC-US): Secondary | ICD-10-CM

## 2022-06-20 DIAGNOSIS — N870 Mild cervical dysplasia: Secondary | ICD-10-CM | POA: Diagnosis not present

## 2022-06-20 NOTE — Progress Notes (Signed)
    LERONDA LEWERS Jul 23, 1961 [id number]        61 y.o.  [id number]   RP: Repeat Pap at 6 months  HPI: Colposcopy Mild dysplasia/CIN 1 in 11/2021.  HR HPV Negative.   OB History  Gravida Para Term Preterm AB Living  2 2 2     2   SAB IAB Ectopic Multiple Live Births               # Outcome Date GA Lbr Len/2nd Weight Sex Delivery Anes PTL Lv  2 Term           1 Term             Past medical history,surgical history, problem list, medications, allergies, family history and social history were all reviewed and documented in the EPIC chart.   Directed ROS with pertinent positives and negatives documented in the history of present illness/assessment and plan.  Exam:  Vitals:   06/20/22 1359  BP: 108/60  Pulse: 62  Resp: 14  Weight: 122 lb (55.3 kg)  Height: 5\' 5"  (1.651 m)   General appearance:  Normal  Gynecologic exam: Vulva normal. Speculum:  Cervix normal.  Pap reflex done.  Vagina normal.   Assessment/Plan:  61 y.o. G2P2002   1. Mild cervical dysplasia, histologically confirmed Colposcopy Mild dysplasia/CIN 1 in 11/2021.  HR HPV Negative.  6 month Pap reflex today. - Cytology - PAP( [facility])  2. ASCUS of cervix with negative high risk HPV  Pap reflex today.  67 MD, 2:10 PM 06/20/2022

## 2022-06-23 LAB — CYTOLOGY - PAP
Comment: NEGATIVE
Diagnosis: UNDETERMINED — AB
High risk HPV: NEGATIVE

## 2022-07-06 DIAGNOSIS — E785 Hyperlipidemia, unspecified: Secondary | ICD-10-CM | POA: Diagnosis not present

## 2022-07-13 DIAGNOSIS — Z0000 Encounter for general adult medical examination without abnormal findings: Secondary | ICD-10-CM | POA: Diagnosis not present

## 2022-07-13 DIAGNOSIS — Z1331 Encounter for screening for depression: Secondary | ICD-10-CM | POA: Diagnosis not present

## 2022-09-12 DIAGNOSIS — D485 Neoplasm of uncertain behavior of skin: Secondary | ICD-10-CM | POA: Diagnosis not present

## 2022-09-12 DIAGNOSIS — I788 Other diseases of capillaries: Secondary | ICD-10-CM | POA: Diagnosis not present

## 2022-09-12 DIAGNOSIS — L820 Inflamed seborrheic keratosis: Secondary | ICD-10-CM | POA: Diagnosis not present

## 2022-09-12 DIAGNOSIS — B070 Plantar wart: Secondary | ICD-10-CM | POA: Diagnosis not present

## 2022-09-12 DIAGNOSIS — L821 Other seborrheic keratosis: Secondary | ICD-10-CM | POA: Diagnosis not present

## 2022-09-12 DIAGNOSIS — D2272 Melanocytic nevi of left lower limb, including hip: Secondary | ICD-10-CM | POA: Diagnosis not present

## 2022-09-12 DIAGNOSIS — B0089 Other herpesviral infection: Secondary | ICD-10-CM | POA: Diagnosis not present

## 2022-09-12 DIAGNOSIS — L570 Actinic keratosis: Secondary | ICD-10-CM | POA: Diagnosis not present

## 2022-10-12 DIAGNOSIS — B078 Other viral warts: Secondary | ICD-10-CM | POA: Diagnosis not present

## 2022-12-22 ENCOUNTER — Other Ambulatory Visit (HOSPITAL_COMMUNITY)
Admission: RE | Admit: 2022-12-22 | Discharge: 2022-12-22 | Disposition: A | Discharge status: Home / Self Care | Payer: BC Managed Care – PPO | Source: Ambulatory Visit | Attending: Obstetrics & Gynecology | Admitting: Obstetrics & Gynecology

## 2022-12-22 ENCOUNTER — Ambulatory Visit (INDEPENDENT_AMBULATORY_CARE_PROVIDER_SITE_OTHER): Payer: BC Managed Care – PPO | Admitting: Obstetrics & Gynecology

## 2022-12-22 ENCOUNTER — Encounter: Payer: Self-pay | Admitting: Obstetrics & Gynecology

## 2022-12-22 VITALS — BP 108/64 | HR 72 | Ht 64.25 in | Wt 121.0 lb

## 2022-12-22 DIAGNOSIS — N870 Mild cervical dysplasia: Secondary | ICD-10-CM | POA: Insufficient documentation

## 2022-12-22 DIAGNOSIS — Z780 Asymptomatic menopausal state: Secondary | ICD-10-CM | POA: Diagnosis not present

## 2022-12-22 DIAGNOSIS — R87610 Atypical squamous cells of undetermined significance on cytologic smear of cervix (ASC-US): Secondary | ICD-10-CM

## 2022-12-22 DIAGNOSIS — Z01419 Encounter for gynecological examination (general) (routine) without abnormal findings: Secondary | ICD-10-CM

## 2022-12-22 NOTE — Progress Notes (Signed)
KONICA MCHARG [birth year] [id number]   History:    62 y.o.  G2P2L2  Daughter is 60 and son is 24 yo.  Son got married in 2022, has a 61 wk old daughter.   RP:  Established patient presenting for annual gyn exam    HPI: Postmenopausal/mild atrophic changes.  No significant menopausal symptoms or any vaginal bleeding. Colpo 11/2021 Mild Cervical Dysplasia.  Repeat Pap 05/2022 ASCUS/HPV HR Neg again.  Pap reflex today.  Breasts normal.  Screening mammo 04/2022 Negative. BD Normal 12/2021. Colonoscopy 03/2019.  Repeat at their recommended interval.  BMI 20.61.  Health Labs with Fam MD.  Continues on Lexapro 10 mg daily for anxiety.    Past medical history,surgical history, family history and social history were all reviewed and documented in the EPIC chart.  Gynecologic History Patient's last menstrual period was 03/08/2015.  Obstetric History OB History  Gravida Para Term Preterm AB Living  2 2 2     2   SAB IAB Ectopic Multiple Live Births               # Outcome Date GA Lbr Len/2nd Weight Sex Delivery Anes PTL Lv  2 Term           1 Term              ROS: A ROS was performed and pertinent positives and negatives are included in the history. GENERAL: No fevers or chills. HEENT: No change in vision, no earache, sore throat or sinus congestion. NECK: No pain or stiffness. CARDIOVASCULAR: No chest pain or pressure. No palpitations. PULMONARY: No shortness of breath, cough or wheeze. GASTROINTESTINAL: No abdominal pain, nausea, vomiting or diarrhea, melena or bright red blood per rectum. GENITOURINARY: No urinary frequency, urgency, hesitancy or dysuria. MUSCULOSKELETAL: No joint or muscle pain, no back pain, no recent trauma. DERMATOLOGIC: No rash, no itching, no lesions. ENDOCRINE: No polyuria, polydipsia, no heat or cold intolerance. No recent change in weight. HEMATOLOGICAL: No anemia or easy bruising or bleeding. NEUROLOGIC: No headache, seizures, numbness, tingling or weakness. PSYCHIATRIC:  No depression, no loss of interest in normal activity or change in sleep pattern.     Exam:   BP 108/64   Pulse 72   Ht 5' 4.25" (1.632 m)   Wt 121 lb (54.9 kg)   LMP 03/08/2015   SpO2 99%   BMI 20.61 kg/m   Body mass index is 20.61 kg/m.  General appearance : Well developed well nourished female. No acute distress HEENT: Eyes: no retinal hemorrhage or exudates,  Neck supple, trachea midline, no carotid bruits, no thyroidmegaly Lungs: Clear to auscultation, no rhonchi or wheezes, or rib retractions  Heart: Regular rate and rhythm, no murmurs or gallops Breast:Examined in sitting and supine position were symmetrical in appearance, no palpable masses or tenderness,  no skin retraction, no nipple inversion, no nipple discharge, no skin discoloration, no axillary or supraclavicular lymphadenopathy Abdomen: no palpable masses or tenderness, no rebound or guarding Extremities: no edema or skin discoloration or tenderness  Pelvic: Vulva: Normal             Vagina: No gross lesions or discharge  Cervix: No gross lesions or discharge.  Pap reflex done.  Uterus  AV, normal size, shape and consistency, non-tender and mobile  Adnexa  Without masses or tenderness  Anus: Normal   Assessment/Plan:  62 y.o. female for annual exam   1. Encounter for routine gynecological examination with Papanicolaou smear of cervix Postmenopausal/mild atrophic  changes.  No significant menopausal symptoms or any vaginal bleeding. Colpo 11/2021 Mild Cervical Dysplasia.  Repeat Pap 05/2022 ASCUS/HPV HR Neg again.  Pap reflex today.  Breasts normal.  Screening mammo 04/2022 Negative. BD Normal 12/2021. Colonoscopy 03/2019.  Repeat at their recommended interval.  BMI 20.61.  Health Labs with Fam MD.  Continues on Lexapro 10 mg daily for anxiety. - Cytology - PAP( [facility])  2. Mild cervical dysplasia, histologically confirmed - Cytology - PAP( [place])  3. ASCUS of cervix with negative high risk HPV  4.  Postmenopause Postmenopausal/mild atrophic changes.  No significant menopausal symptoms or any vaginal bleeding.  Other orders - PREVIDENT 5000 BOOSTER PLUS 1.1 % PSTE; Place onto teeth. - valACYclovir (VALTREX) 1000 MG tablet; Take by mouth as needed.   Genia Del MD, 8:52 AM

## 2022-12-28 LAB — CYTOLOGY - PAP
Comment: NEGATIVE
High risk HPV: NEGATIVE

## 2023-04-24 ENCOUNTER — Other Ambulatory Visit: Payer: Self-pay | Admitting: Internal Medicine

## 2023-04-24 DIAGNOSIS — Z1231 Encounter for screening mammogram for malignant neoplasm of breast: Secondary | ICD-10-CM

## 2023-05-15 DIAGNOSIS — H6993 Unspecified Eustachian tube disorder, bilateral: Secondary | ICD-10-CM | POA: Diagnosis not present

## 2023-05-29 ENCOUNTER — Ambulatory Visit
Admission: RE | Admit: 2023-05-29 | Discharge: 2023-05-29 | Disposition: A | Discharge status: Home / Self Care | Payer: BC Managed Care – PPO | Source: Ambulatory Visit | Attending: Internal Medicine | Admitting: Internal Medicine

## 2023-05-29 DIAGNOSIS — Z1231 Encounter for screening mammogram for malignant neoplasm of breast: Secondary | ICD-10-CM

## 2023-06-27 DIAGNOSIS — M13142 Monoarthritis, not elsewhere classified, left hand: Secondary | ICD-10-CM | POA: Diagnosis not present

## 2023-07-11 DIAGNOSIS — J069 Acute upper respiratory infection, unspecified: Secondary | ICD-10-CM | POA: Diagnosis not present

## 2023-07-11 DIAGNOSIS — R051 Acute cough: Secondary | ICD-10-CM | POA: Diagnosis not present

## 2023-07-30 DIAGNOSIS — E785 Hyperlipidemia, unspecified: Secondary | ICD-10-CM | POA: Diagnosis not present

## 2023-08-02 DIAGNOSIS — E785 Hyperlipidemia, unspecified: Secondary | ICD-10-CM | POA: Diagnosis not present

## 2023-08-02 DIAGNOSIS — Z1389 Encounter for screening for other disorder: Secondary | ICD-10-CM | POA: Diagnosis not present

## 2023-08-02 DIAGNOSIS — F411 Generalized anxiety disorder: Secondary | ICD-10-CM | POA: Diagnosis not present

## 2023-08-02 DIAGNOSIS — Z0000 Encounter for general adult medical examination without abnormal findings: Secondary | ICD-10-CM | POA: Diagnosis not present

## 2023-08-02 DIAGNOSIS — Z1339 Encounter for screening examination for other mental health and behavioral disorders: Secondary | ICD-10-CM | POA: Diagnosis not present

## 2023-08-02 DIAGNOSIS — Z1331 Encounter for screening for depression: Secondary | ICD-10-CM | POA: Diagnosis not present

## 2023-09-17 DIAGNOSIS — I788 Other diseases of capillaries: Secondary | ICD-10-CM | POA: Diagnosis not present

## 2023-09-17 DIAGNOSIS — D2261 Melanocytic nevi of right upper limb, including shoulder: Secondary | ICD-10-CM | POA: Diagnosis not present

## 2023-09-17 DIAGNOSIS — C44519 Basal cell carcinoma of skin of other part of trunk: Secondary | ICD-10-CM | POA: Diagnosis not present

## 2023-09-17 DIAGNOSIS — B0089 Other herpesviral infection: Secondary | ICD-10-CM | POA: Diagnosis not present

## 2023-09-17 DIAGNOSIS — B078 Other viral warts: Secondary | ICD-10-CM | POA: Diagnosis not present

## 2023-12-31 ENCOUNTER — Telehealth: Payer: Self-pay | Admitting: Obstetrics and Gynecology

## 2023-12-31 DIAGNOSIS — E2839 Other primary ovarian failure: Secondary | ICD-10-CM

## 2023-12-31 NOTE — Telephone Encounter (Signed)
 F/u dxa fpr annual exam Dr. Tia Flowers

## 2024-01-03 ENCOUNTER — Ambulatory Visit: Admitting: Obstetrics and Gynecology

## 2024-01-03 ENCOUNTER — Encounter: Payer: Self-pay | Admitting: Obstetrics and Gynecology

## 2024-01-03 ENCOUNTER — Other Ambulatory Visit (HOSPITAL_COMMUNITY)
Admission: RE | Admit: 2024-01-03 | Discharge: 2024-01-03 | Disposition: A | Discharge status: Home / Self Care | Source: Ambulatory Visit | Attending: Obstetrics and Gynecology | Admitting: Obstetrics and Gynecology

## 2024-01-03 VITALS — BP 112/74 | HR 72 | Ht 64.0 in | Wt 122.0 lb

## 2024-01-03 DIAGNOSIS — Z01419 Encounter for gynecological examination (general) (routine) without abnormal findings: Secondary | ICD-10-CM | POA: Insufficient documentation

## 2024-01-03 DIAGNOSIS — E785 Hyperlipidemia, unspecified: Secondary | ICD-10-CM | POA: Diagnosis not present

## 2024-01-03 DIAGNOSIS — N952 Postmenopausal atrophic vaginitis: Secondary | ICD-10-CM | POA: Diagnosis not present

## 2024-01-03 DIAGNOSIS — Z1231 Encounter for screening mammogram for malignant neoplasm of breast: Secondary | ICD-10-CM

## 2024-01-03 DIAGNOSIS — E559 Vitamin D deficiency, unspecified: Secondary | ICD-10-CM | POA: Diagnosis not present

## 2024-01-03 DIAGNOSIS — E2839 Other primary ovarian failure: Secondary | ICD-10-CM | POA: Diagnosis not present

## 2024-01-03 DIAGNOSIS — Z1331 Encounter for screening for depression: Secondary | ICD-10-CM | POA: Diagnosis not present

## 2024-01-03 DIAGNOSIS — Z1211 Encounter for screening for malignant neoplasm of colon: Secondary | ICD-10-CM

## 2024-01-03 MED ORDER — INTRAROSA 6.5 MG VA INST: 1.0000 | VAGINAL_INSERT | Freq: Every evening | VAGINAL | 12 refills | Status: DC | PRN

## 2024-01-03 MED ORDER — ESCITALOPRAM OXALATE 10 MG PO TABS: 10.0000 mg | ORAL_TABLET | Freq: Every day | ORAL | 4 refills | Status: AC

## 2024-01-03 NOTE — Patient Instructions (Signed)
 The [facility] number to schedule the bone scan is [phone]  Another option if they are behind is [facility] and their number is [phone].  I can send the referral if you want to go there.    Please let me know if you have any trouble scheduling. This is important for management of osteoporosis or osteopenia.   I can sit down with you after the scan to discuss results and treatment.  Dr. Tia Flowers

## 2024-01-03 NOTE — Progress Notes (Signed)
 63 y.o. y.o. female here for annual exam. Patient's last menstrual period was 03/08/2015.     G2P2L2  Daughter is 42 and son is 72 yo.  Son got married in 2022, has a 63 wk old daughter.   RP:  Established patient presenting for annual gyn exam    HPI: Postmenopausal/mild atrophic changes and dyspareunia.  She would like to try intrarosa.  No significant menopausal symptoms or any vaginal bleeding. Colpo 11/2021 Mild Cervical Dysplasia.  Repeat Pap 05/2022 ASCUS/HPV HR Neg again.  Pap reflex today.  Breasts normal.  Screening mammo 05/31/23 Negative. BD Normal 12/2021 repeat ordered with risks. Colonoscopy 03/2019 reports to repeat in 5 years.  BMI 20.61.  Health Labs with Fam MD.  Continues on Lexapro  10 mg daily for anxiety.    Mother with fracture of femur and osteoporosis Body mass index is 20.94 kg/m.     01/03/2024    3:11 PM  Depression screen PHQ 2/9  Decreased Interest 0  Down, Depressed, Hopeless 0  PHQ - 2 Score 0    Blood pressure 112/74, pulse 72, height 5' 4 (1.626 m), weight 122 lb (55.3 kg), last menstrual period 03/08/2015, SpO2 98%.     Component Value Date/Time   DIAGPAP - Atrophic pattern with epithelial atypia (A) 12/22/2022 0916   DIAGPAP (A) 06/20/2022 1420    - Atypical squamous cells of undetermined significance (ASC-US )   DIAGPAP - Atrophic pattern with epithelial atypia (A) 08/01/2021 1601   HPVHIGH Negative 12/22/2022 0916   HPVHIGH Negative 06/20/2022 1420   HPVHIGH Negative 08/01/2021 1601   ADEQPAP  12/22/2022 0916    Satisfactory for evaluation; transformation zone component PRESENT.   ADEQPAP  06/20/2022 1420    Satisfactory for evaluation; transformation zone component PRESENT.   ADEQPAP  08/01/2021 1601    Satisfactory for evaluation. The presence or absence of an   ADEQPAP  08/01/2021 1601    endocervical/transformation zone component cannot be determined because   ADEQPAP of atrophy. 08/01/2021 1601    GYN HISTORY:    Component  Value Date/Time   DIAGPAP - Atrophic pattern with epithelial atypia (A) 12/22/2022 0916   DIAGPAP (A) 06/20/2022 1420    - Atypical squamous cells of undetermined significance (ASC-US )   DIAGPAP - Atrophic pattern with epithelial atypia (A) 08/01/2021 1601   HPVHIGH Negative 12/22/2022 0916   HPVHIGH Negative 06/20/2022 1420   HPVHIGH Negative 08/01/2021 1601   ADEQPAP  12/22/2022 0916    Satisfactory for evaluation; transformation zone component PRESENT.   ADEQPAP  06/20/2022 1420    Satisfactory for evaluation; transformation zone component PRESENT.   ADEQPAP  08/01/2021 1601    Satisfactory for evaluation. The presence or absence of an   ADEQPAP  08/01/2021 1601    endocervical/transformation zone component cannot be determined because   ADEQPAP of atrophy. 08/01/2021 1601    OB History  Gravida Para Term Preterm AB Living  2 2 2   2   SAB IAB Ectopic Multiple Live Births          # Outcome Date GA Lbr Len/2nd Weight Sex Type Anes PTL Lv  2 Term           1 Term             Past Medical History:  Diagnosis Date   Allergy    ASCUS of cervix with negative high risk HPV 12/2018   HSV infection    oral   Hx gestational diabetes  LGSIL (low grade squamous intraepithelial dysplasia) 05/2005    Past Surgical History:  Procedure Laterality Date   LASIK  BILATERAL   Lypoma excised from shoulder      Current Outpatient Medications on File Prior to Visit  Medication Sig Dispense Refill   cholecalciferol (VITAMIN D3) 25 MCG (1000 UNIT) tablet 1 tablet     Cyanocobalamin (VITAMIN B 12 PO) Take by mouth.     escitalopram  (LEXAPRO ) 10 MG tablet Take 1 tablet (10 mg total) by mouth daily. 90 tablet 4   fexofenadine  (ALLEGRA) 180 MG tablet Take 180 mg by mouth daily.       Multiple Vitamin (MULTI VITAMIN) TABS 1 tablet     PREVIDENT 5000 BOOSTER PLUS 1.1 % PSTE Place onto teeth.     valACYclovir (VALTREX) 1000 MG tablet Take by mouth as needed.     No current  facility-administered medications on file prior to visit.    Social History   Socioeconomic History   Marital status: Married    Spouse name: Not on file   Number of children: Not on file   Years of education: Not on file   Highest education level: Not on file  Occupational History   Not on file  Tobacco Use   Smoking status: Never   Smokeless tobacco: Never  Vaping Use   Vaping status: Never Used  Substance and Sexual Activity   Alcohol  use: Yes    Comment: Occas   Drug use: No   Sexual activity: Yes    Birth control/protection: Other-see comments, Post-menopausal    Comment: vasectomy-1st intercourse 63 yo-Fewer than 5 partners  Other Topics Concern   Not on file  Social History Narrative   Not on file   Social Drivers of Health   Financial Resource Strain: Not on file  Food Insecurity: Not on file  Transportation Needs: Not on file  Physical Activity: Not on file  Stress: Not on file  Social Connections: Not on file  Intimate Partner Violence: Not on file    Family History  Problem Relation Age of Onset   Osteoarthritis Mother    Hypertension Mother    Cancer Father        Prostate cancer   Heart disease Paternal Grandmother      Allergies  Allergen Reactions   Latex Rash      Patient's last menstrual period was Patient's last menstrual period was 03/08/2015.Aaron Aas            Review of Systems Alls systems reviewed and are negative.     Physical Exam Constitutional:      Appearance: Normal appearance.  Genitourinary:     Vulva and urethral meatus normal.     No lesions in the vagina.     Right Labia: No rash, lesions or skin changes.    Left Labia: No lesions, skin changes or rash.    No vaginal discharge or tenderness.     No vaginal prolapse present.    Moderate vaginal atrophy present.     Right Adnexa: not tender, not palpable and no mass present.    Left Adnexa: not tender, not palpable and no mass present.    No cervical motion  tenderness or discharge.     Uterus is not enlarged, tender or irregular.  Breasts:    Right: Normal.     Left: Normal.  HENT:     Head: Normocephalic.  Neck:     Thyroid : No thyroid  mass, thyromegaly or thyroid  tenderness.  Cardiovascular:     Rate and Rhythm: Normal rate and regular rhythm.     Heart sounds: Normal heart sounds, S1 normal and S2 normal.  Pulmonary:     Effort: Pulmonary effort is normal.     Breath sounds: Normal breath sounds and air entry.  Abdominal:     General: There is no distension.     Palpations: Abdomen is soft. There is no mass.     Tenderness: There is no abdominal tenderness. There is no guarding or rebound.   Musculoskeletal:        General: Normal range of motion.     Cervical back: Full passive range of motion without pain, normal range of motion and neck supple. No tenderness.     Right lower leg: No edema.     Left lower leg: No edema.   Neurological:     Mental Status: She is alert.   Skin:    General: Skin is warm.   Psychiatric:        Mood and Affect: Mood normal.        Behavior: Behavior normal.        Thought Content: Thought content normal.  Vitals and nursing note reviewed. Exam conducted with a chaperone present.       A:         Well Woman GYN exam                             P:        Pap smear collected today Encouraged annual mammogram screening Colon cancer screening referral placed today DXA ordered today Labs and immunizations to do with PMD labs sent if bone therapy is indicated Discussed breast self exams Encouraged healthy lifestyle practices Encouraged Vit D and Calcium   No follow-ups on file.  Reinaldo Caras

## 2024-01-04 ENCOUNTER — Ambulatory Visit: Payer: Self-pay | Admitting: Obstetrics and Gynecology

## 2024-01-04 DIAGNOSIS — J302 Other seasonal allergic rhinitis: Secondary | ICD-10-CM | POA: Diagnosis not present

## 2024-01-04 DIAGNOSIS — J029 Acute pharyngitis, unspecified: Secondary | ICD-10-CM | POA: Diagnosis not present

## 2024-01-04 LAB — COMPREHENSIVE METABOLIC PANEL WITH GFR
AG Ratio: 1.8 (calc) (ref 1.0–2.5)
ALT: 17 U/L (ref 6–29)
AST: 20 U/L (ref 10–35)
Albumin: 4.3 g/dL (ref 3.6–5.1)
Alkaline phosphatase (APISO): 64 U/L (ref 37–153)
BUN: 21 mg/dL (ref 7–25)
CO2: 26 mmol/L (ref 20–32)
Calcium: 9.2 mg/dL (ref 8.6–10.4)
Chloride: 100 mmol/L (ref 98–110)
Creat: 0.89 mg/dL (ref 0.50–1.05)
Globulin: 2.4 g/dL (ref 1.9–3.7)
Glucose, Bld: 94 mg/dL (ref 65–99)
Potassium: 4.2 mmol/L (ref 3.5–5.3)
Sodium: 136 mmol/L (ref 135–146)
Total Bilirubin: 0.5 mg/dL (ref 0.2–1.2)
Total Protein: 6.7 g/dL (ref 6.1–8.1)
eGFR: 73 mL/min/{1.73_m2} (ref >=60)

## 2024-01-04 LAB — VITAMIN D 25 HYDROXY (VIT D DEFICIENCY, FRACTURES): Vit D, 25-Hydroxy: 55 ng/mL (ref 30–100)

## 2024-01-10 LAB — CYTOLOGY - PAP
Comment: NEGATIVE
Diagnosis: UNDETERMINED — AB
High risk HPV: NEGATIVE

## 2024-03-19 ENCOUNTER — Encounter: Payer: Self-pay | Admitting: Obstetrics and Gynecology

## 2024-03-28 ENCOUNTER — Ambulatory Visit (INDEPENDENT_AMBULATORY_CARE_PROVIDER_SITE_OTHER): Admitting: Nurse Practitioner

## 2024-03-28 ENCOUNTER — Encounter: Payer: Self-pay | Admitting: Nurse Practitioner

## 2024-03-28 VITALS — BP 114/64 | HR 63 | Ht 64.0 in | Wt 123.0 lb

## 2024-03-28 DIAGNOSIS — N952 Postmenopausal atrophic vaginitis: Secondary | ICD-10-CM | POA: Diagnosis not present

## 2024-03-28 DIAGNOSIS — N898 Other specified noninflammatory disorders of vagina: Secondary | ICD-10-CM

## 2024-03-28 LAB — WET PREP FOR TRICH, YEAST, CLUE

## 2024-03-28 MED ORDER — ESTRADIOL 0.1 MG/GM VA CREA: 1.0000 g | TOPICAL_CREAM | Freq: Every evening | VAGINAL | 1 refills | Status: AC

## 2024-03-28 NOTE — Progress Notes (Signed)
   Acute Office Visit  Subjective:    Patient ID: Virginia Hanna, female    DOB: [birth year], 62 y.o.   MRN: [id number]   HPI 63 y.o. presents today for vaginal itching with mild odor x 1 week. No discharge. Started intrarosa  in June, using intermittently.    Patient's last menstrual period was 03/08/2015.    Review of Systems  Constitutional: Negative.   Genitourinary:  Positive for vaginal pain (Itching, dryness). Negative for vaginal bleeding and vaginal discharge.       Objective:    Physical Exam Constitutional:      Appearance: Normal appearance.  Genitourinary:    General: Normal vulva.     Vagina: No vaginal discharge or erythema.     Cervix: Normal.     Comments: Atrophic changes    BP 114/64 (BP Location: Left Arm, Patient Position: Sitting, Cuff Size: Normal)   Pulse 63   Ht 5' 4 (1.626 m)   Wt 123 lb (55.8 kg)   LMP 03/08/2015   SpO2 98%   BMI 21.11 kg/m  Wt Readings from Last 3 Encounters:  03/28/24 123 lb (55.8 kg)  01/03/24 122 lb (55.3 kg)  12/22/22 121 lb (54.9 kg)        Dereck Keas, CMA present as Biomedical engineer.   Wet prep negative  Assessment & Plan:   Problem List Items Addressed This Visit   None Visit Diagnoses       Vaginal atrophy    -  Primary   Relevant Medications   estradiol  (ESTRACE  VAGINAL) 0.1 MG/GM vaginal cream     Vaginal itching       Relevant Orders   WET PREP FOR TRICH, YEAST, CLUE      Plan: Negative wet prep. Will switch to vaginal estrogen. Use nightly first 2 weeks then decrease to twice weekly.   Return if symptoms worsen or fail to improve.    Annabella DELENA Shutter DNP, 11:24 AM 03/28/2024

## 2024-04-01 DIAGNOSIS — Z1211 Encounter for screening for malignant neoplasm of colon: Secondary | ICD-10-CM | POA: Diagnosis not present

## 2024-04-01 DIAGNOSIS — Z860100 Personal history of colon polyps, unspecified: Secondary | ICD-10-CM | POA: Diagnosis not present

## 2024-04-07 ENCOUNTER — Telehealth: Payer: Self-pay

## 2024-04-07 NOTE — Telephone Encounter (Signed)
 Pt states the spotting just happened this morning. She did use the applicator last night but she has never spotted before this. She states no intercourse. And no cramping or abd pain.   She states that she does have on a panty liner and has had very little blood on it.

## 2024-04-07 NOTE — Telephone Encounter (Signed)
 Pt called the triage line stating that she was having some vaginal spotting this morning. She states that she has been on the Estrace  vaginal cream for a week now. This is the first time she has had an issues.   Please advise

## 2024-04-07 NOTE — Telephone Encounter (Signed)
 Could spotting be from applicator? Did it occur the day after using the estrogen or after intercourse?

## 2024-04-09 NOTE — Telephone Encounter (Signed)
 Vaginal estrogen can cause spotting, especially when first starting treatment as it is adjusting to the increased levels of estrogen. If spotting continues more than a few more weeks I do recommend being seen.

## 2024-04-10 NOTE — Telephone Encounter (Signed)
 Patinet aware of recommendations and voiced understanding. She she she only had the spotting that one time.

## 2024-05-23 DIAGNOSIS — J069 Acute upper respiratory infection, unspecified: Secondary | ICD-10-CM | POA: Diagnosis not present

## 2024-05-23 DIAGNOSIS — R0981 Nasal congestion: Secondary | ICD-10-CM | POA: Diagnosis not present

## 2024-07-21 ENCOUNTER — Telehealth: Payer: Self-pay | Admitting: *Deleted

## 2024-07-21 NOTE — Telephone Encounter (Signed)
 Spoke with patient. Patient request to schedule OV for HRT consult. States she has been experiencing change in energy levels, would like to discuss. Denies any other GYN symptoms. States symptoms have been present for quite some time.   OV scheduled with Dr. Glennon for 08/12/24 at 0930.   Last AEX 01/03/24  Routing to provider for final review. Patient is agreeable to disposition. Will close encounter.

## 2024-08-04 ENCOUNTER — Ambulatory Visit: Payer: Self-pay | Admitting: Obstetrics and Gynecology

## 2024-08-04 LAB — HM COLONOSCOPY

## 2024-08-06 ENCOUNTER — Ambulatory Visit
Admission: RE | Admit: 2024-08-06 | Discharge: 2024-08-06 | Disposition: A | Discharge status: Home / Self Care | Source: Ambulatory Visit | Attending: Obstetrics and Gynecology | Admitting: Obstetrics and Gynecology

## 2024-08-06 DIAGNOSIS — Z1231 Encounter for screening mammogram for malignant neoplasm of breast: Secondary | ICD-10-CM

## 2024-08-06 DIAGNOSIS — Z01419 Encounter for gynecological examination (general) (routine) without abnormal findings: Secondary | ICD-10-CM

## 2024-08-12 ENCOUNTER — Ambulatory Visit: Admitting: Obstetrics and Gynecology

## 2024-08-12 VITALS — BP 114/74 | HR 82 | Ht 65.0 in | Wt 123.0 lb

## 2024-08-12 DIAGNOSIS — Z9229 Personal history of other drug therapy: Secondary | ICD-10-CM | POA: Diagnosis not present

## 2024-08-12 MED ORDER — INTRAROSA 6.5 MG VA INST: 1.0000 | VAGINAL_INSERT | Freq: Every evening | VAGINAL | 12 refills | Status: AC | PRN

## 2024-08-12 MED ORDER — CLIMARA PRO 0.045-0.015 MG/DAY TD PTWK: 1.0000 | MEDICATED_PATCH | TRANSDERMAL | 12 refills | Status: AC

## 2024-08-12 NOTE — Progress Notes (Signed)
" ° °  Acute Office Visit  Subjective:    Patient ID: Virginia Hanna, female    DOB: [birth year], 64 y.o.   MRN: [id number]   HPI 64 y.o. presents today for office visit (HRT conversation/Pt reports taking collagen and Nutrafol) . Patient denies any h/o HRT use but is using occasional vaginal estrogen. Biggest concern is energy and mid day fatigue and feels like she needs to take a nap. Is sleeping well straight through at night 9-10 hours No hot flashes Reports hair loss, but was in a stressful time period with her husband running for the city counsel.  This is better now Denies any joint pain. Some memory fog. Denies any history of DVT, HTN or stroke. Increased cholesterol. Reports ct cardiac score test was normal. She would like to try HRT and see if this helps her feel better.  Patient's last menstrual period was 03/08/2015.    Review of Systems     Objective:    OBGyn Exam  BP 114/74 (BP Location: Left Arm, Patient Position: Sitting, Cuff Size: Normal)   Pulse 82   Ht 5' 5 (1.651 m)   Wt 123 lb (55.8 kg)   LMP 03/08/2015   SpO2 98%   BMI 20.47 kg/m  Wt Readings from Last 3 Encounters:  08/12/24 123 lb (55.8 kg)  03/28/24 123 lb (55.8 kg)  01/03/24 122 lb (55.3 kg)          Assessment & Plan:  HRT consult  Counseled on the r/b/a/I of HRT use.  Discussed that she will need progesterone  to avoid unopposed estrogen on the endometrial lining and risk for endometrial cancer. Discussed lower risk for DVT and stroke with the patch. Side effects include risk of breast tenderness and spotting along with low risk of blood clots and stroke with uncontrolled hypertension. Best time to start is before 60. She would still like to continue with HRT use. Counseled on the benefits to help improve the bone density during menopause., hot flash relief, decreased risk for CAD with early starts and dementia. Sample of climara  pro was given to patient and instructions discussed with  patient. Discussed concern for low testosterone. Can use intrarosa  and add in 4 weeks, after starting climara  pro.  Discussed option with testosterone gel, which is non FDA approved and may need periodic testosterone level check. Follow with either visit or mychart in 4 weeks on how she is feeling.  Offered blood work, but her symptoms and how she is feeling will determine more than her blood work.  45 minutes spent on reviewing records, imaging,  and one on one patient time and counseling patient and documentation Dr. Glennon [name] Glennon "

## 2024-08-12 NOTE — Patient Instructions (Signed)
 Perimenopause/Menopause suggestions   You should be getting 1,200 mg of calcium a day between your diet and supplements and at least 3000 IU a day of Vit D. You should exercise regularly with weight bearing exercises. I would recommend a bone density q 2 years.  We loose 5% per year in this time period of our bone density, due to the loss of estrogen. Magnesium  at bedtime for our sleep and bones (follow recommended dose on bottle)  Please read The New Menopause my Dr. Ronal Estefana Morton and Estrogen Matters  Try to avoid caffeine and alcohol in this period, as they can make symptoms worse  Continue annual mammograms, unless told sooner  Please reach out with any questions Almarie MARLA Carpen

## 2024-08-15 ENCOUNTER — Telehealth: Payer: Self-pay

## 2024-08-15 NOTE — Telephone Encounter (Signed)
 Pt left a voicemail stating the prescription for the estradiol  patch is not covered by her insurance. Pt has [employer]. Pt stated it was $200 for 28 days. Pt is wondering if MD could please send her a more affordable/alternate Rx, that is covered by BC/BS?

## 2024-08-17 ENCOUNTER — Other Ambulatory Visit: Payer: Self-pay | Admitting: Obstetrics and Gynecology

## 2024-08-17 MED ORDER — PROGESTERONE MICRONIZED 100 MG PO CAPS: 100.0000 mg | ORAL_CAPSULE | Freq: Every day | ORAL | 6 refills | Status: AC

## 2024-08-17 MED ORDER — ESTRADIOL 0.0375 MG/24HR TD PTTW: 1.0000 | MEDICATED_PATCH | TRANSDERMAL | 12 refills | Status: AC

## 2024-08-18 NOTE — Telephone Encounter (Signed)
 My Chart message sent

## 2024-09-16 ENCOUNTER — Ambulatory Visit (HOSPITAL_BASED_OUTPATIENT_CLINIC_OR_DEPARTMENT_OTHER)

## 2024-10-10 ENCOUNTER — Institutional Professional Consult (permissible substitution) (HOSPITAL_BASED_OUTPATIENT_CLINIC_OR_DEPARTMENT_OTHER): Admitting: Internal Medicine
